# Patient Record
Sex: Female | Born: 1965 | Race: Asian | Hispanic: No | Marital: Married | Smoking: Never smoker
Health system: Southern US, Community
[De-identification: ages and names within clinical notes are randomized; demographics above are authoritative.]

## PROBLEM LIST (undated history)

## (undated) DIAGNOSIS — C801 Malignant (primary) neoplasm, unspecified: Secondary | ICD-10-CM

## (undated) DIAGNOSIS — Z87442 Personal history of urinary calculi: Secondary | ICD-10-CM

---

## 2006-06-10 ENCOUNTER — Emergency Department (HOSPITAL_COMMUNITY): Admission: EM | Admit: 2006-06-10 | Discharge: 2006-06-10 | Payer: Self-pay | Admitting: Family Medicine

## 2006-09-24 ENCOUNTER — Ambulatory Visit (HOSPITAL_COMMUNITY): Admission: RE | Admit: 2006-09-24 | Discharge: 2006-09-24 | Payer: Self-pay | Admitting: Urology

## 2007-07-21 ENCOUNTER — Emergency Department (HOSPITAL_COMMUNITY): Admission: EM | Admit: 2007-07-21 | Discharge: 2007-07-21 | Payer: Self-pay | Admitting: Emergency Medicine

## 2007-08-24 ENCOUNTER — Ambulatory Visit (HOSPITAL_COMMUNITY): Admission: RE | Admit: 2007-08-24 | Discharge: 2007-08-24 | Payer: Self-pay | Admitting: Obstetrics & Gynecology

## 2007-12-24 ENCOUNTER — Inpatient Hospital Stay (HOSPITAL_COMMUNITY): Admission: AD | Admit: 2007-12-24 | Discharge: 2007-12-27 | Payer: Self-pay | Admitting: Obstetrics and Gynecology

## 2007-12-24 ENCOUNTER — Ambulatory Visit: Payer: Self-pay | Admitting: Physician Assistant

## 2011-06-30 LAB — CBC
HCT: 36.5
Hemoglobin: 11.8 — ABNORMAL LOW
Platelets: 171
RDW: 17.1 — ABNORMAL HIGH
WBC: 14.4 — ABNORMAL HIGH

## 2011-06-30 LAB — RPR: RPR Ser Ql: NONREACTIVE

## 2012-01-07 ENCOUNTER — Emergency Department (INDEPENDENT_AMBULATORY_CARE_PROVIDER_SITE_OTHER)
Admission: EM | Admit: 2012-01-07 | Discharge: 2012-01-07 | Disposition: A | Discharge status: Home / Self Care | Payer: Self-pay | Source: Home / Self Care | Attending: Family Medicine | Admitting: Family Medicine

## 2012-01-07 ENCOUNTER — Encounter (HOSPITAL_COMMUNITY): Payer: Self-pay

## 2012-01-07 DIAGNOSIS — E049 Nontoxic goiter, unspecified: Secondary | ICD-10-CM

## 2012-01-07 LAB — TSH: TSH: <0.008 u[IU]/mL — ABNORMAL LOW (ref 0.350–4.500)

## 2012-01-07 LAB — T3, FREE: T3, Free: 5.6 pg/mL — ABNORMAL HIGH (ref 2.3–4.2)

## 2012-01-07 NOTE — ED Notes (Signed)
Here w translator who states she is here for a referral for her thyroid problem; NAD at  present

## 2012-01-07 NOTE — ED Provider Notes (Signed)
History     CSN: [id number]  Arrival date & time 01/07/12  7829   First MD Initiated Contact with Patient 01/07/12 [phone]      Chief Complaint  Patient presents with  . Thyroid Problem    (Consider location/radiation/quality/duration/timing/severity/associated sxs/prior treatment) HPI Comments: Marissa [name] presents today for evaluation of swelling of her anterior neck. She reports a long-standing history of this however she reports increased size over the last 3 weeks. She denies any known thyroid disease. She denies any heart palpitations, cold or heat intolerance, vision disturbance, or dysphagia. She does report a 5 pound weight loss. She does not speak [place]. [place] the [place]. She does not have insurance, Medicaid, or a primary care provider. She is here with a friend translating requesting evaluation for thyroid disease.  Patient is a 46 y.o. female presenting with general illness. The history is provided by the patient.  Illness  The current episode started more than 2 weeks ago. The problem has been gradually worsening. The problem is mild. The symptoms are relieved by nothing. The symptoms are aggravated by nothing. Pertinent negatives include no fever, no decreased vision, no double vision, no photophobia, no abdominal pain, no sore throat, no neck pain and no neck stiffness.    History reviewed. No pertinent past medical history.  History reviewed. No pertinent past surgical history.  History reviewed. No pertinent family history.  History  Substance Use Topics  . Smoking status: Never Smoker   . Smokeless tobacco: Not on file  . Alcohol Use: No    OB History    Grav Para Term Preterm Abortions TAB SAB Ect Mult Living                  Review of Systems  Constitutional: Positive for unexpected weight change. Negative for fever.  HENT: Negative.  Negative for sore throat, trouble swallowing and neck pain.   Eyes: Negative.  Negative for double vision  and photophobia.  Respiratory: Negative.   Cardiovascular: Negative.  Negative for chest pain and palpitations.  Gastrointestinal: Negative.  Negative for abdominal pain.  Genitourinary: Negative.   Musculoskeletal: Negative.   Skin: Negative.   Neurological: Negative.     Allergies  Review of patient's allergies indicates not on file.  Home Medications  No current outpatient prescriptions on file.  BP 112/68  Pulse 72  Temp(Src) 98.5 F (36.9 C) (Oral)  Resp 16  SpO2 98%  Physical Exam  Nursing note and vitals reviewed. Constitutional: She is oriented to person, place, and time. She appears well-developed and well-nourished.  HENT:  Head: Normocephalic and atraumatic.  Eyes: EOM are normal.  Neck: Trachea normal, normal range of motion and full passive range of motion without pain. No tracheal tenderness and no muscular tenderness present. Thyromegaly present.  Cardiovascular: Normal rate, regular rhythm, S1 normal, S2 normal and normal heart sounds.   No murmur heard. Pulmonary/Chest: Effort normal and breath sounds normal. She has no wheezes. She has no rhonchi.  Musculoskeletal: Normal range of motion.  Neurological: She is alert and oriented to person, place, and time.  Skin: Skin is warm, dry and intact. No rash noted.  Psychiatric: Her behavior is normal.    ED Course  Procedures (including critical care time)   Labs Reviewed  TSH  T4, FREE  T3, FREE   No results found.   1. Goiter       MDM  Referred to [facility] and  [facility]; referred to Endocrinology; labs pending        Renaee Munda, MD 01/07/12 1247

## 2012-01-07 NOTE — Discharge Instructions (Signed)
Your examination and history are concerning for thyroid disease. We have drawn blood today to assess thyroid function. We will call you with any abnormal or positive results and therefore further instructions. I provided you with several referral providers. The first 2 are for primary care. The first number is for the [facility]; they accept Medicaid. The second number is for a physician referral service; you may call and inquire about physician offices that accept Medicaid. The last 2 contact numbers are for endocrinologists; these are physicians and evaluate and treat thyroid disorders. Please call them to to make an appointment.

## 2012-01-09 NOTE — ED Notes (Signed)
4/3 Medical follow-up request form received from Dr. Juanetta Gosling for appointment with Endocrinology for a goiter.  4/5  TSH <0.008L Free T 4 1.65, Free T 3 5.6 H. Labs shown to Dr. Juanetta Gosling. He wrote finding suggests Hyperthyroidism. Refer to Endo Chestine Spore or Sharl Ma). I called and Dr. Burna Forts office was closed. I called the [facility]. and left message for the Jeri Modena the new patient coordinator. I faxed the record, labs and referral request to [phone] and confirmation received. Vassie Moselle 01/09/2012

## 2012-01-15 ENCOUNTER — Telehealth (HOSPITAL_COMMUNITY): Payer: Self-pay | Admitting: *Deleted

## 2012-01-22 ENCOUNTER — Telehealth (HOSPITAL_COMMUNITY): Payer: Self-pay | Admitting: *Deleted

## 2012-01-22 NOTE — ED Notes (Signed)
Called pt and spoke to husband and son.  Son states pt has been contacted by [facility] and is waiting for a return call with her appointment time.  Instructed son to call and let us know when the appointment is.

## 2012-05-13 ENCOUNTER — Other Ambulatory Visit: Payer: Self-pay | Admitting: Otolaryngology

## 2012-05-13 DIAGNOSIS — E041 Nontoxic single thyroid nodule: Secondary | ICD-10-CM

## 2012-05-14 ENCOUNTER — Ambulatory Visit
Admission: RE | Admit: 2012-05-14 | Discharge: 2012-05-14 | Disposition: A | Discharge status: Home / Self Care | Payer: BC Managed Care – PPO | Source: Ambulatory Visit | Attending: Otolaryngology | Admitting: Otolaryngology

## 2012-05-14 DIAGNOSIS — E041 Nontoxic single thyroid nodule: Secondary | ICD-10-CM

## 2012-05-19 ENCOUNTER — Other Ambulatory Visit (HOSPITAL_COMMUNITY)
Admission: RE | Admit: 2012-05-19 | Discharge: 2012-05-19 | Disposition: A | Discharge status: Home / Self Care | Payer: BC Managed Care – PPO | Source: Ambulatory Visit | Attending: Otolaryngology | Admitting: Otolaryngology

## 2012-05-19 DIAGNOSIS — E049 Nontoxic goiter, unspecified: Secondary | ICD-10-CM | POA: Insufficient documentation

## 2012-07-14 ENCOUNTER — Encounter (HOSPITAL_COMMUNITY): Payer: Self-pay | Admitting: Pharmacy Technician

## 2012-07-19 ENCOUNTER — Encounter (HOSPITAL_COMMUNITY)
Admission: RE | Admit: 2012-07-19 | Discharge: 2012-07-19 | Disposition: A | Discharge status: Home / Self Care | Payer: BC Managed Care – PPO | Source: Ambulatory Visit | Attending: Otolaryngology | Admitting: Otolaryngology

## 2012-07-19 ENCOUNTER — Encounter (HOSPITAL_COMMUNITY): Payer: Self-pay

## 2012-07-19 LAB — CBC
HCT: 37 % (ref 36.0–46.0)
MCH: 22.8 pg — ABNORMAL LOW (ref 26.0–34.0)
MCV: 72.8 fL — ABNORMAL LOW (ref 78.0–100.0)
Platelets: 183 10*3/uL (ref 150–400)
RDW: 14.6 % (ref 11.5–15.5)
WBC: 6.4 10*3/uL (ref 4.0–10.5)

## 2012-07-19 LAB — BASIC METABOLIC PANEL
Chloride: 105 mEq/L (ref 96–112)
Creatinine, Ser: 0.59 mg/dL (ref 0.50–1.10)
GFR calc Af Amer: >90 mL/min (ref >90)
Sodium: 141 mEq/L (ref 135–145)

## 2012-07-19 NOTE — Progress Notes (Signed)
CXR to anesthesia to review. 

## 2012-07-19 NOTE — Pre-Procedure Instructions (Signed)
20 Cylee Thi  07/19/2012   Your procedure is scheduled on:  07/26/12  Report to [facility] at 530 AM.  Call this number if you have problems the morning of surgery: [phone]   Remember:   Do not eat food:After Midnight.    Take these medicines the morning of surgery with A SIP OF WATER: none   Do not wear jewelry, make-up or nail polish.  Do not wear lotions, powders, or perfumes. You may wear deodorant.  Do not shave 48 hours prior to surgery. Men may shave face and neck.  Do not bring valuables to the hospital.  Contacts, dentures or bridgework may not be worn into surgery.  Leave suitcase in the car. After surgery it may be brought to your room.  For patients admitted to the hospital, checkout time is 11:00 AM the day of discharge.   Patients discharged the day of surgery will not be allowed to drive home.  Name and phone number of your driver: family  Special Instructions: Shower using CHG 2 nights before surgery and the night before surgery.  If you shower the day of surgery use CHG.  Use special wash - you have one bottle of CHG for all showers.  You should use approximately 1/3 of the bottle for each shower.   Please read over the following fact sheets that you were given: Pain Booklet, Coughing and Deep Breathing, MRSA Information and Surgical Site Infection Prevention

## 2012-07-20 NOTE — Consult Note (Signed)
Anesthesia chart review: Patient is a 46 year old [place] female posted for right thyroidectomy, possible total on 07/23/2012 by Dr. Pollyann Kennedy. Non-smoker.   Thyroid ultrasound on 05/14/12 showed: Right thyroid lobe: 6.8 x 2.1 x 2.8 cm.  Left thyroid lobe: 6.5 x 1.6 x 1.8 cm.  Isthmus: 2.5 mm in thickness.  The thyroid gland is slightly well and inhomogeneous with solid nodules bilaterally.  CXR on 07/19/12 showed: There is new mild cardiomegaly. Pulmonary vascularity is normal. Lungs are clear. There is a 3 x 3.2 cm nodule in the right upper lobe medially which is essentially unchanged since a study of 2007 and felt to be a benign mass. Nipple shadows are seen at both lung bases. Slight thoracolumbar scoliosis.   Labs noted.  I discussed new finding of mild cardiomegaly on CXR with Anesthesiologist Dr. Noreene Larsson.  No CV/CHF symptoms were documented at her PAT appointment.  Will not plan to get a  pre-operative EKG.  Patient will be evaluated by her Anesthesiologist on the day of surgery.  Shonna Chock, PA-C

## 2012-07-21 NOTE — H&P (Signed)
Assessment  . 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 Discussed  Right-sided thyroid mass, slowly enlarging over the last 6 years or so. Recommend thyroid ultrasound. We'll discuss possible need for FNA following that. Reason For Visit  Marissa [name] is here today at the kind request of none for consultation and opinion for lump on neck and painful. HPI  Immigrant from [place], has been here for about 6 or 7 years. Over the past 5 or 6 years, she's had a slowly enlarging mass in the right anterior lower neck area. She has no trouble swallowing and no other symptoms or health problems. Allergies  No Known Drug Allergies. Current Meds  No Reported Medications;; RPT. Personal Hx  Never A Smoker Never Drank Alcohol. ROS  12 system ROS was obtained and reviewed on the Health Maintenance form dated today.  Positive responses are shown above.  If the symptom is not checked, the patient has denied it. Vital Signs   Recorded by [facility] on 13 May 2012 01:03 PM BP:102/64,  Height: 61.5 in, Weight: 124 lb, BMI: 23.1 kg/m2,  BSA Calculated: 1.55 ,  BMI Calculated: 23.05. Physical Exam  APPEARANCE: Well developed, well nourished, in no acute distress.  Normal affect, in a pleasant mood.  Oriented to time, place and person. COMMUNICATION: Normal voice   HEAD & FACE:  No scars, lesions or masses of head and face.  Sinuses nontender to palpation.  Salivary glands without mass or tenderness.  Facial strength symmetric.  No facial lesion, scars, or mass. EYES: EOMI with normal primary gaze alignment. Visual acuity grossly intact.  PERRLA EXTERNAL EAR & NOSE: No scars, lesions or masses  EAC & TYMPANIC MEMBRANE:  EAC shows no obstructing lesions or debris and tympanic membranes are normal bilaterally with good movement to insufflation. GROSS HEARING: Normal  TMJ:  Nontender  INTRANASAL EXAM: No polyps or purulence.  NASOPHARYNX:  Normal, without lesions. LIPS, TEETH & GUMS: No lip lesions, normal dentition and normal gums. ORAL CAVITY/OROPHARYNX:  Oral mucosa moist without lesion or asymmetry of the palate, tongue, tonsil or posterior pharynx. LARYNX (mirror exam):  No lesions of the epiglottis, false cord or TVC's and cords move well to phonation. HYPOPHARYNX (mirror exam): No lesions, asymmetry or pooling of secretions. NECK:  Supple without adenopathy or mass. THYROID: 2 cm firm mass in the right upper pole of the thyroid. No other masses palpable.  NEUROLOGIC:  No gross CN deficits. No nystagmus noted.   LYMPHATIC:  No enlarged nodes palpable. Signature  Electronically signed by : Serena Colonel  M.D.; 05/13/2012 1:21 PM EST.

## 2012-07-22 MED ORDER — CEFAZOLIN SODIUM-DEXTROSE 2-3 GM-% IV SOLR
2.0000 g | INTRAVENOUS | Status: AC
  Administered 2012-07-23: 2 g via INTRAVENOUS
  Filled 2012-07-22: qty 50

## 2012-07-23 ENCOUNTER — Encounter (HOSPITAL_COMMUNITY): Payer: Self-pay | Admitting: Vascular Surgery

## 2012-07-23 ENCOUNTER — Ambulatory Visit (HOSPITAL_COMMUNITY): Payer: BC Managed Care – PPO | Admitting: Vascular Surgery

## 2012-07-23 ENCOUNTER — Encounter (HOSPITAL_COMMUNITY): Payer: Self-pay | Admitting: *Deleted

## 2012-07-23 ENCOUNTER — Encounter (HOSPITAL_COMMUNITY)
Admission: RE | Disposition: A | Discharge status: Home / Self Care | Payer: Self-pay | Source: Ambulatory Visit | Attending: Otolaryngology

## 2012-07-23 ENCOUNTER — Observation Stay (HOSPITAL_COMMUNITY)
Admission: RE | Admit: 2012-07-23 | Discharge: 2012-07-24 | Disposition: A | Discharge status: Home / Self Care | Payer: BC Managed Care – PPO | Source: Ambulatory Visit | Attending: Otolaryngology | Admitting: Otolaryngology

## 2012-07-23 DIAGNOSIS — C73 Malignant neoplasm of thyroid gland: Principal | ICD-10-CM | POA: Insufficient documentation

## 2012-07-23 DIAGNOSIS — Z01818 Encounter for other preprocedural examination: Secondary | ICD-10-CM | POA: Insufficient documentation

## 2012-07-23 DIAGNOSIS — Z9889 Other specified postprocedural states: Secondary | ICD-10-CM

## 2012-07-23 DIAGNOSIS — E063 Autoimmune thyroiditis: Secondary | ICD-10-CM | POA: Insufficient documentation

## 2012-07-23 DIAGNOSIS — Z01812 Encounter for preprocedural laboratory examination: Secondary | ICD-10-CM | POA: Insufficient documentation

## 2012-07-23 DIAGNOSIS — Z23 Encounter for immunization: Secondary | ICD-10-CM | POA: Insufficient documentation

## 2012-07-23 HISTORY — PX: THYROIDECTOMY: SHX17

## 2012-07-23 SURGERY — THYROIDECTOMY
Anesthesia: General | Site: Neck | Laterality: Right | Wound class: Clean

## 2012-07-23 MED ORDER — ACETAMINOPHEN 10 MG/ML IV SOLN
1000.0000 mg | Freq: Once | INTRAVENOUS | Status: AC | PRN
  Administered 2012-07-23: 1000 mg via INTRAVENOUS

## 2012-07-23 MED ORDER — MIDAZOLAM HCL 5 MG/5ML IJ SOLN
INTRAMUSCULAR | Status: DC | PRN
  Administered 2012-07-23 (×2): 1 mg via INTRAVENOUS

## 2012-07-23 MED ORDER — FENTANYL CITRATE 0.05 MG/ML IJ SOLN
INTRAMUSCULAR | Status: DC | PRN
  Administered 2012-07-23: 50 ug via INTRAVENOUS
  Administered 2012-07-23: 150 ug via INTRAVENOUS
  Administered 2012-07-23 (×4): 50 ug via INTRAVENOUS

## 2012-07-23 MED ORDER — INFLUENZA VIRUS VACC SPLIT PF IM SUSP
0.5000 mL | INTRAMUSCULAR | Status: AC
  Administered 2012-07-24: 0.5 mL via INTRAMUSCULAR
  Filled 2012-07-23 (×2): qty 0.5

## 2012-07-23 MED ORDER — HYDROMORPHONE HCL PF 1 MG/ML IJ SOLN: 0.2500 mg | INTRAMUSCULAR | Status: DC | PRN

## 2012-07-23 MED ORDER — BACITRACIN ZINC 500 UNIT/GM EX OINT
TOPICAL_OINTMENT | CUTANEOUS | Status: AC
  Filled 2012-07-23: qty 15

## 2012-07-23 MED ORDER — NEOMYCIN-BACITRACIN ZN-POLYMYX 5-400-10000 OP OINT
TOPICAL_OINTMENT | OPHTHALMIC | Status: AC
  Filled 2012-07-23: qty 3.5

## 2012-07-23 MED ORDER — HYDROCODONE-ACETAMINOPHEN 7.5-325 MG PO TABS: 1.0000 | ORAL_TABLET | Freq: Four times a day (QID) | ORAL | Status: DC | PRN

## 2012-07-23 MED ORDER — DEXTROSE-NACL 5-0.9 % IV SOLN
INTRAVENOUS | Status: DC
  Administered 2012-07-23 – 2012-07-24 (×2): 75 mL/h via INTRAVENOUS

## 2012-07-23 MED ORDER — ARTIFICIAL TEARS OP OINT
TOPICAL_OINTMENT | OPHTHALMIC | Status: DC | PRN
  Administered 2012-07-23: 1 via OPHTHALMIC

## 2012-07-23 MED ORDER — LIDOCAINE-EPINEPHRINE 1 %-1:100000 IJ SOLN
INTRAMUSCULAR | Status: AC
  Filled 2012-07-23: qty 1

## 2012-07-23 MED ORDER — LIDOCAINE HCL 4 % MT SOLN
OROMUCOSAL | Status: DC | PRN
  Administered 2012-07-23: 4 mL via TOPICAL

## 2012-07-23 MED ORDER — GLYCOPYRROLATE 0.2 MG/ML IJ SOLN
INTRAMUSCULAR | Status: DC | PRN
  Administered 2012-07-23: 0.4 mg via INTRAVENOUS
  Administered 2012-07-23: 0.2 mg via INTRAVENOUS

## 2012-07-23 MED ORDER — PROMETHAZINE HCL 25 MG RE SUPP: 25.0000 mg | Freq: Four times a day (QID) | RECTAL | Status: DC | PRN

## 2012-07-23 MED ORDER — PROPOFOL 10 MG/ML IV BOLUS
INTRAVENOUS | Status: DC | PRN
  Administered 2012-07-23: 110 mg via INTRAVENOUS

## 2012-07-23 MED ORDER — ONDANSETRON HCL 4 MG/2ML IJ SOLN: 4.0000 mg | Freq: Once | INTRAMUSCULAR | Status: DC | PRN

## 2012-07-23 MED ORDER — LIDOCAINE-EPINEPHRINE 1 %-1:100000 IJ SOLN
INTRAMUSCULAR | Status: DC | PRN
  Administered 2012-07-23: 2 mL

## 2012-07-23 MED ORDER — HYDROCODONE-ACETAMINOPHEN 5-325 MG PO TABS
1.0000 | ORAL_TABLET | ORAL | Status: DC | PRN
  Administered 2012-07-23: 2 via ORAL
  Administered 2012-07-23: 1 via ORAL
  Administered 2012-07-23 – 2012-07-24 (×2): 2 via ORAL
  Filled 2012-07-23 (×3): qty 2

## 2012-07-23 MED ORDER — NEOSTIGMINE METHYLSULFATE 1 MG/ML IJ SOLN
INTRAMUSCULAR | Status: DC | PRN
  Administered 2012-07-23: 1 mg via INTRAVENOUS
  Administered 2012-07-23: 3 mg via INTRAVENOUS

## 2012-07-23 MED ORDER — LIDOCAINE HCL (CARDIAC) 20 MG/ML IV SOLN
INTRAVENOUS | Status: DC | PRN
  Administered 2012-07-23: 30 mg via INTRAVENOUS

## 2012-07-23 MED ORDER — PROMETHAZINE HCL 25 MG PO TABS: 25.0000 mg | ORAL_TABLET | Freq: Four times a day (QID) | ORAL | Status: DC | PRN

## 2012-07-23 MED ORDER — ONDANSETRON HCL 4 MG/2ML IJ SOLN
INTRAMUSCULAR | Status: DC | PRN
  Administered 2012-07-23: 4 mg via INTRAVENOUS

## 2012-07-23 MED ORDER — IBUPROFEN 100 MG/5ML PO SUSP
400.0000 mg | Freq: Four times a day (QID) | ORAL | Status: DC | PRN
  Administered 2012-07-23: 400 mg via ORAL
  Filled 2012-07-23: qty 20

## 2012-07-23 MED ORDER — LACTATED RINGERS IV SOLN
INTRAVENOUS | Status: DC | PRN
  Administered 2012-07-23: 07:00:00 via INTRAVENOUS

## 2012-07-23 MED ORDER — HYDROCODONE-ACETAMINOPHEN 7.5-500 MG/15ML PO SOLN
ORAL | Status: AC
  Filled 2012-07-23: qty 15

## 2012-07-23 MED ORDER — PHENYLEPHRINE HCL 10 MG/ML IJ SOLN
INTRAMUSCULAR | Status: DC | PRN
  Administered 2012-07-23 (×2): 40 ug via INTRAVENOUS

## 2012-07-23 MED ORDER — ACETAMINOPHEN 10 MG/ML IV SOLN
INTRAVENOUS | Status: AC
  Filled 2012-07-23: qty 100

## 2012-07-23 MED ORDER — ROCURONIUM BROMIDE 100 MG/10ML IV SOLN
INTRAVENOUS | Status: DC | PRN
  Administered 2012-07-23: 10 mg via INTRAVENOUS
  Administered 2012-07-23: 40 mg via INTRAVENOUS

## 2012-07-23 SURGICAL SUPPLY — 43 items
ADH SKN CLS APL DERMABOND .7 (GAUZE/BANDAGES/DRESSINGS) ×1
APPLIER CLIP 9.375 SM OPEN (CLIP)
APR CLP SM 9.3 20 MLT OPN (CLIP)
ATTRACTOMAT 16X20 MAGNETIC DRP (DRAPES) IMPLANT
CANISTER SUCTION 2500CC (MISCELLANEOUS) ×2 IMPLANT
CLEANER TIP ELECTROSURG 2X2 (MISCELLANEOUS) ×2 IMPLANT
CLIP APPLIE 9.375 SM OPEN (CLIP) IMPLANT
CLOTH BEACON ORANGE TIMEOUT ST (SAFETY) ×2 IMPLANT
CONT SPEC 4OZ CLIKSEAL STRL BL (MISCELLANEOUS) ×2 IMPLANT
CORDS BIPOLAR (ELECTRODE) ×2 IMPLANT
COVER SURGICAL LIGHT HANDLE (MISCELLANEOUS) ×2 IMPLANT
DECANTER SPIKE VIAL GLASS SM (MISCELLANEOUS) ×2 IMPLANT
DERMABOND ADVANCED (GAUZE/BANDAGES/DRESSINGS) ×1
DERMABOND ADVANCED .7 DNX12 (GAUZE/BANDAGES/DRESSINGS) IMPLANT
DRAIN SNY 10 ROU (WOUND CARE) ×1 IMPLANT
ELECT COATED BLADE 2.86 ST (ELECTRODE) ×2 IMPLANT
ELECT REM PT RETURN 9FT ADLT (ELECTROSURGICAL) ×2
ELECTRODE REM PT RTRN 9FT ADLT (ELECTROSURGICAL) ×1 IMPLANT
EVACUATOR SILICONE 100CC (DRAIN) ×2 IMPLANT
GAUZE SPONGE 4X4 16PLY XRAY LF (GAUZE/BANDAGES/DRESSINGS) ×1 IMPLANT
GLOVE BIO SURGEON STRL SZ 6.5 (GLOVE) ×1 IMPLANT
GLOVE ECLIPSE 7.5 STRL STRAW (GLOVE) ×2 IMPLANT
GOWN PREVENTION PLUS LG XLONG (DISPOSABLE) ×5 IMPLANT
KIT BASIN OR (CUSTOM PROCEDURE TRAY) ×2 IMPLANT
KIT ROOM TURNOVER OR (KITS) ×2 IMPLANT
NEEDLE 27GAX1X1/2 (NEEDLE) ×2 IMPLANT
NS IRRIG 1000ML POUR BTL (IV SOLUTION) ×2 IMPLANT
PAD ARMBOARD 7.5X6 YLW CONV (MISCELLANEOUS) ×4 IMPLANT
PENCIL FOOT CONTROL (ELECTRODE) ×2 IMPLANT
SPECIMEN JAR MEDIUM (MISCELLANEOUS) ×1 IMPLANT
SPONGE INTESTINAL PEANUT (DISPOSABLE) IMPLANT
STAPLER VISISTAT 35W (STAPLE) ×2 IMPLANT
SUT CHROMIC 3 0 SH 27 (SUTURE) IMPLANT
SUT CHROMIC 4 0 PS 2 18 (SUTURE) ×4 IMPLANT
SUT ETHILON 3 0 PS 1 (SUTURE) IMPLANT
SUT ETHILON 5 0 P 3 18 (SUTURE) ×1
SUT NYLON ETHILON 5-0 P-3 1X18 (SUTURE) ×1 IMPLANT
SUT SILK 3 0 REEL (SUTURE) IMPLANT
SUT SILK 4 0 REEL (SUTURE) ×3 IMPLANT
TOWEL OR 17X24 6PK STRL BLUE (TOWEL DISPOSABLE) ×2 IMPLANT
TOWEL OR 17X26 10 PK STRL BLUE (TOWEL DISPOSABLE) ×2 IMPLANT
TRAY ENT MC OR (CUSTOM PROCEDURE TRAY) ×2 IMPLANT
WATER STERILE IRR 1000ML POUR (IV SOLUTION) IMPLANT

## 2012-07-23 NOTE — OR Nursing (Signed)
Vocalizes with slight hoarseness/ no difficulty swallowing / no hematoma or swelling noted

## 2012-07-23 NOTE — Anesthesia Postprocedure Evaluation (Signed)
  Anesthesia Post-op Note  Patient: Marissa [name]  Procedure(s) Performed: Procedure(s) (LRB) with comments: THYROIDECTOMY (Right) - Right thyroid lobectomy possible total   Patient Location: PACU  Anesthesia Type: General  Level of Consciousness: awake, alert  and oriented  Airway and Oxygen Therapy: Patient Spontanous Breathing  Post-op Pain: mild  Post-op Assessment: Post-op Vital signs reviewed and Patient's Cardiovascular Status Stable  Post-op Vital Signs: stable  Complications: No apparent anesthesia complications

## 2012-07-23 NOTE — Anesthesia Postprocedure Evaluation (Signed)
  Anesthesia Post-op Note  Patient: Marissa [name]  Procedure(s) Performed: Procedure(s) (LRB) with comments: THYROIDECTOMY (Right) - Right thyroid lobectomy possible total   Patient Location: PACU  Anesthesia Type: General  Level of Consciousness: awake, alert  and oriented  Airway and Oxygen Therapy: Patient Spontanous Breathing  Post-op Pain: moderate  Post-op Assessment: Post-op Vital signs reviewed  Post-op Vital Signs: Reviewed and stable  Complications: No apparent anesthesia complications

## 2012-07-23 NOTE — Anesthesia Preprocedure Evaluation (Addendum)
Anesthesia Evaluation  Patient identified by MRN, date of birth, ID band Patient awake    Reviewed: Allergy & Precautions, H&P , NPO status , Patient's Chart, lab work & pertinent test results  History of Anesthesia Complications Negative for: history of anesthetic complications  Airway Mallampati: II      Dental  (+) Teeth Intact and Dental Advisory Given   Pulmonary neg pulmonary ROS,  breath sounds clear to auscultation        Cardiovascular negative cardio ROS  Rhythm:Regular Rate:Normal     Neuro/Psych negative neurological ROS     GI/Hepatic negative GI ROS, Neg liver ROS,   Endo/Other    Renal/GU negative Renal ROS  negative genitourinary   Musculoskeletal negative musculoskeletal ROS (+)   Abdominal   Peds  Hematology negative hematology ROS (+)   Anesthesia Other Findings   Reproductive/Obstetrics negative OB ROS                          Anesthesia Physical Anesthesia Plan  ASA: III  Anesthesia Plan: General   Post-op Pain Management:    Induction: Intravenous  Airway Management Planned: Oral ETT  Additional Equipment:   Intra-op Plan:   Post-operative Plan: Extubation in OR  Informed Consent: I have reviewed the patients History and Physical, chart, labs and discussed the procedure including the risks, benefits and alternatives for the proposed anesthesia with the patient or authorized representative who has indicated his/her understanding and acceptance.   Dental advisory given  Plan Discussed with:   Anesthesia Plan Comments: (Thyroid mass  Speaks little English  Kipp Brood, MD)        Anesthesia Quick Evaluation

## 2012-07-23 NOTE — Op Note (Signed)
OPERATIVE REPORT  DATE OF SURGERY: 07/23/2012  PATIENT:  Marissa [name],  46 y.o. female  PRE-OPERATIVE DIAGNOSIS:  thyroid nodule  POST-OPERATIVE DIAGNOSIS:  thyroid nodule  PROCEDURE:  Procedure(s): THYROIDECTOMY  SURGEON:  Susy Frizzle, MD  ASSISTANTS: Aquilla Hacker, PA    ANESTHESIA:   general  EBL:  20 ml  DRAINS: (1/8') Jackson-Pratt drain(s) with closed bulb suction in the neck   LOCAL MEDICATIONS USED:  LIDOCAINE   SPECIMEN:  Source of Specimen:  Right thyroid lobectomy with isthmus  COUNTS:  YES  PROCEDURE DETAILS: Patient was taken to the operating room and placed on the operating table in the supine position. Following induction of general endotracheal anesthesia, the neck was prepped and draped in a standard fashion. A shoulder roll was placed beneath the shoulder blades to extend the neck. A transverse incision was outlined with a marking pen along a lower cervical skin crease. This was then injected with Xylocaine/epinephrine solution. A 15 scalpel was used to incise the skin and subcutaneous tissue. Electrocautery was used to continue the dissection down to the subplatysmal layer. Flaps rotated up to the thyroid notch and down to the clavicle. A thyroid retractor was used for the remainder of the case. The midline fascia was divided and the strap muscles were reflected laterally off the right thyroid lobe. The thyroid gland had multiple hard nodules. There was one dominant 1 which was the one that was biopsied. It was reflected towards the left side while the dissection was carried out right along the capsule. The superior vasculature was separately identified, ligated between clamps and divided. The middle thyroid vein was treated in a similar fashion. As the gland was brought forward, the recurrent nerve was identified as well as a parathyroid gland. These were preserved. The gland was brought off the trachea. The ligament of Allyson Sabal was divided as the gland was brought off  of the trachea isthmus was divided on the left side to accomplish a small nodule that is present in the left inferior isthmus. 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 4-0 silk ties were used throughout the case for ligatures. The wound was irrigated with saline and hemostasis was completed. The drain was left in the wound and exited through the left-sided incision, secured in place with nylon suture. The midline fascia and subcutaneous layer were reapproximated as well as the platysma layer, using 4-0 chromic suture. Dermabond was used on the skin.   PATIENT DISPOSITION:  PACU - hemodynamically stable.

## 2012-07-23 NOTE — Interval H&P Note (Signed)
History and Physical Interval Note:  07/23/2012 7:17 AM  Marissa [name]  has presented today for surgery, with the diagnosis of thyroid nodule  The various methods of treatment have been discussed with the patient and family. After consideration of risks, benefits and other options for treatment, the patient has consented to  Procedure(s) (LRB) with comments: THYROIDECTOMY (Right) - Right thyroid lobectomy possible total  as a surgical intervention .  The patient's history has been reviewed, patient examined, no change in status, stable for surgery.  I have reviewed the patient's chart and labs.  Questions were answered to the patient's satisfaction.     Sendy Pluta

## 2012-07-23 NOTE — Preoperative (Signed)
Beta Blockers   Reason not to administer Beta Blockers: not prescribed 

## 2012-07-23 NOTE — Anesthesia Procedure Notes (Signed)
Procedure Name: Intubation Date/Time: 07/23/2012 7:57 AM Performed by: Gayla Medicus Pre-anesthesia Checklist: Patient identified, Timeout performed, Emergency Drugs available, Suction available and Patient being monitored Patient Marissa [name]-evaluated:Patient Marissa [name]-evaluated prior to inductionOxygen Delivery Method: Circle system utilized Preoxygenation: Pre-oxygenation with 100% oxygen Intubation Type: IV induction Ventilation: Mask ventilation without difficulty Laryngoscope Size: Mac and 3 Grade View: Grade I Tube type: Oral Tube size: 7.0 mm Number of attempts: 1 Airway Equipment and Method: Stylet and LTA kit utilized Placement Confirmation: ETT inserted through vocal cords under direct vision,  positive ETCO2 and breath sounds checked- equal and bilateral Secured at: 22 cm Tube secured with: Tape Dental Injury: Teeth and Oropharynx as per pre-operative assessment

## 2012-07-23 NOTE — Progress Notes (Signed)
Day of Surgery  Subjective: Patient does not speak english.  Her husband speaks some english and says that she is having pain with swallowing.  No other complaints.  Objective: Vital signs in last 24 hours: Temp:  [97.4 F (36.3 C)-98.4 F (36.9 C)] 98.2 F (36.8 C) (10/18 1654) Pulse Rate:  [67-93] 86  (10/18 1654) Resp:  [12-20] 18  (10/18 1654) BP: (102-124)/(51-79) 116/62 mmHg (10/18 1654) SpO2:  [99 %-100 %] 100 % (10/18 1654)    Intake/Output from previous day:   Intake/Output this shift: Total I/O In: 1845 [P.O.:240; I.V.:1600; Other:5] Out: -   General appearance: alert, cooperative and no distress Neck: thryoidectomy incision clean and intact.  No fluid collection.  Drain functioning.  Voice sounds good.  Lab Results:  No results found for this basename: WBC:2,HGB:2,HCT:2,PLT:2 in the last 72 hours BMET No results found for this basename: NA:2,K:2,CL:2,CO2:2,GLUCOSE:2,BUN:2,CREATININE:2,CALCIUM:2 in the last 72 hours PT/INR No results found for this basename: LABPROT:2,INR:2 in the last 72 hours ABG No results found for this basename: PHART:2,PCO2:2,PO2:2,HCO3:2 in the last 72 hours  Studies/Results: No results found.  Anti-infectives: Anti-infectives     Start     Dose/Rate Route Frequency Ordered Stop   07/23/12 0600   ceFAZolin (ANCEF) IVPB 2 g/50 mL premix        2 g 100 mL/hr over 30 Minutes Intravenous On call to O.R. 07/22/12 1444 07/23/12 0808          Assessment/Plan: s/p Procedure(s) (LRB) with comments: THYROIDECTOMY (Right) - Right thyroid lobectomy possible total  Doing well.  Pain medication.  Drain overnight.  Likely discharge tomorrow.  LOS: 0 days    Marissa [name] 07/23/2012

## 2012-07-23 NOTE — Transfer of Care (Signed)
Immediate Anesthesia Transfer of Care Note  Patient: Marissa [name]  Procedure(s) Performed: Procedure(s) (LRB) with comments: THYROIDECTOMY (Right) - Right thyroid lobectomy possible total   Patient Location: PACU  Anesthesia Type: General  Level of Consciousness: awake and alert   Airway & Oxygen Therapy: Patient Spontanous Breathing and Patient connected to nasal cannula oxygen  Post-op Assessment: Report given to PACU RN, Post -op Vital signs reviewed and stable and Patient moving all extremities X 4  Post vital signs: Reviewed and stable  Complications: No apparent anesthesia complications

## 2012-07-24 NOTE — Discharge Summary (Signed)
Physician Discharge Summary  Patient ID: Marissa [name] MRN: [id number] DOB/AGE: [birth year] 46 y.o.  Admit date: 07/23/2012 Discharge date: 07/24/2012  Admission Diagnoses: Right thyroid nodule  Discharge Diagnoses: Same.   Discharged Condition: good  Hospital Course: 46 year old female underwent right thyroid lobectomy for thyroid nodule.  For details, see the operative note.  After surgery, she was admitted for observation with a drain in place.  She did quite well overnight with limited pain.  Eating and drinking well and ambulating.  Good voice.  Consults: None  Significant Diagnostic Studies: None  Treatments: surgery: right thyroid lobectomy  Discharge Exam: Blood pressure 111/61, pulse 87, temperature 97.9 F (36.6 C), temperature source Oral, resp. rate 16, height 5\' 1"  (1.549 m), weight 58.786 kg (129 lb 9.6 oz), SpO2 100.00%. General appearance: alert, cooperative and no distress Neck: thyroidectomy incision clean and intact. No fluid collection.  Drain removed.  Normal voice.  Disposition: 01-Home or Self Care  Discharge Orders    Future Orders Please Complete By Expires   Diet - low sodium heart healthy      Diet - low sodium heart healthy      Increase activity slowly      Increase activity slowly      Discharge instructions      Comments:   Do not apply any ointment to the incision.  OK to allow it to get wet.  Gently pat dry.  Call with fever, pus drainage, or increasing pain or redness.       Medication List     As of 07/24/2012 11:18 AM    TAKE these medications         HYDROcodone-acetaminophen 7.5-325 MG per tablet   Commonly known as: NORCO   Take 1 tablet by mouth every 6 (six) hours as needed for pain.      promethazine 25 MG suppository   Commonly known as: PHENERGAN   Place 1 suppository (25 mg total) rectally every 6 (six) hours as needed for nausea.           Follow-up Information    Follow up with Serena Colonel, MD. Schedule an  appointment as soon as possible for a visit in 1 week.   Contact information:   [address], [address] [place], [place] [address] [place] [address] [phone]          Signed: Christia Reading 07/24/2012, 11:18 AM

## 2012-07-27 ENCOUNTER — Encounter (HOSPITAL_COMMUNITY): Payer: Self-pay | Admitting: Otolaryngology

## 2012-09-14 ENCOUNTER — Encounter (HOSPITAL_COMMUNITY): Payer: Self-pay

## 2012-09-14 ENCOUNTER — Encounter (HOSPITAL_COMMUNITY)
Admission: RE | Admit: 2012-09-14 | Discharge: 2012-09-14 | Disposition: A | Discharge status: Home / Self Care | Payer: BC Managed Care – PPO | Source: Ambulatory Visit | Attending: Otolaryngology | Admitting: Otolaryngology

## 2012-09-14 LAB — CBC
HCT: 37.8 % (ref 36.0–46.0)
Hemoglobin: 12.1 g/dL (ref 12.0–15.0)
MCH: 24 pg — ABNORMAL LOW (ref 26.0–34.0)
MCV: 74.9 fL — ABNORMAL LOW (ref 78.0–100.0)
RBC: 5.05 MIL/uL (ref 3.87–5.11)

## 2012-09-14 NOTE — Pre-Procedure Instructions (Signed)
20 Marissa [name]  09/14/2012   Your procedure is scheduled on:  Decmber 11, 2013  Wednesday  Report to [facility] at 0930 AM.  Call this number if you have problems the morning of surgery: [phone]   Remember:   Do not eat food do not drink liquids :After Midnight.   Clear liquids include soda, tea, black coffee, apple or grape juice, broth.  Take these medicines the morning of surgery with A SIP OF WATER: none   Do not wear jewelry, make-up or nail polish.  Do not wear lotions, powders, or perfumes. You may wear deodorant.  Do not shave 48 hours prior to surgery. Men may shave face and neck.  Do not bring valuables to the hospital.  Contacts, dentures or bridgework may not be worn into surgery.  Leave suitcase in the car. After surgery it may be brought to your room.  For patients admitted to the hospital, checkout time is 11:00 AM the day of discharge.   Patients discharged the day of surgery will not be allowed to drive home.  Name and phone number of your driver: Sharee Pimple- husband  Special Instructions: N/A   Please read over the following fact sheets that you were given: Pain Booklet, Coughing and Deep Breathing, Lab Information, MRSA Information and Surgical Site Infection Prevention

## 2012-09-15 ENCOUNTER — Encounter (HOSPITAL_COMMUNITY): Payer: Self-pay | Admitting: Anesthesiology

## 2012-09-15 ENCOUNTER — Observation Stay (HOSPITAL_COMMUNITY)
Admission: RE | Admit: 2012-09-15 | Discharge: 2012-09-16 | Disposition: A | Discharge status: Home / Self Care | Payer: BC Managed Care – PPO | Source: Ambulatory Visit | Attending: Otolaryngology | Admitting: Otolaryngology

## 2012-09-15 ENCOUNTER — Ambulatory Visit (HOSPITAL_COMMUNITY): Payer: BC Managed Care – PPO | Admitting: Anesthesiology

## 2012-09-15 ENCOUNTER — Encounter (HOSPITAL_COMMUNITY)
Admission: RE | Disposition: A | Discharge status: Home / Self Care | Payer: Self-pay | Source: Ambulatory Visit | Attending: Otolaryngology

## 2012-09-15 DIAGNOSIS — Z9889 Other specified postprocedural states: Secondary | ICD-10-CM

## 2012-09-15 DIAGNOSIS — Z01812 Encounter for preprocedural laboratory examination: Secondary | ICD-10-CM | POA: Insufficient documentation

## 2012-09-15 DIAGNOSIS — C73 Malignant neoplasm of thyroid gland: Principal | ICD-10-CM | POA: Insufficient documentation

## 2012-09-15 HISTORY — PX: THYROIDECTOMY: SHX17

## 2012-09-15 HISTORY — DX: Malignant (primary) neoplasm, unspecified: C80.1

## 2012-09-15 LAB — CALCIUM
Calcium: 8.7 mg/dL (ref 8.4–10.5)
Calcium: 8.3 mg/dL — ABNORMAL LOW (ref 8.4–10.5)

## 2012-09-15 SURGERY — THYROIDECTOMY
Anesthesia: General | Site: Neck | Laterality: Left | Wound class: Clean

## 2012-09-15 MED ORDER — LIDOCAINE-EPINEPHRINE 1 %-1:100000 IJ SOLN: INTRAMUSCULAR | Status: DC | PRN

## 2012-09-15 MED ORDER — GLYCOPYRROLATE 0.2 MG/ML IJ SOLN
INTRAMUSCULAR | Status: DC | PRN
  Administered 2012-09-15: 0.3 mg via INTRAVENOUS

## 2012-09-15 MED ORDER — ONDANSETRON HCL 4 MG/2ML IJ SOLN
INTRAMUSCULAR | Status: DC | PRN
  Administered 2012-09-15: 4 mg via INTRAVENOUS

## 2012-09-15 MED ORDER — PROMETHAZINE HCL 25 MG RE SUPP: 25.0000 mg | Freq: Four times a day (QID) | RECTAL | Status: DC | PRN

## 2012-09-15 MED ORDER — DEXTROSE-NACL 5-0.9 % IV SOLN
INTRAVENOUS | Status: DC
  Administered 2012-09-15: 15:00:00 via INTRAVENOUS
  Administered 2012-09-16: 75 mL/h via INTRAVENOUS

## 2012-09-15 MED ORDER — FENTANYL CITRATE 0.05 MG/ML IJ SOLN
INTRAMUSCULAR | Status: DC | PRN
  Administered 2012-09-15 (×2): 50 ug via INTRAVENOUS
  Administered 2012-09-15: 100 ug via INTRAVENOUS
  Administered 2012-09-15: 50 ug via INTRAVENOUS

## 2012-09-15 MED ORDER — ROCURONIUM BROMIDE 100 MG/10ML IV SOLN
INTRAVENOUS | Status: DC | PRN
  Administered 2012-09-15: 30 mg via INTRAVENOUS

## 2012-09-15 MED ORDER — LIDOCAINE-EPINEPHRINE 1 %-1:100000 IJ SOLN
INTRAMUSCULAR | Status: AC
  Filled 2012-09-15: qty 1

## 2012-09-15 MED ORDER — LIDOCAINE HCL (CARDIAC) 20 MG/ML IV SOLN
INTRAVENOUS | Status: DC | PRN
  Administered 2012-09-15: 90 mg via INTRAVENOUS

## 2012-09-15 MED ORDER — BACITRACIN ZINC 500 UNIT/GM EX OINT
TOPICAL_OINTMENT | CUTANEOUS | Status: AC
  Filled 2012-09-15: qty 15

## 2012-09-15 MED ORDER — FENTANYL CITRATE 0.05 MG/ML IJ SOLN
INTRAMUSCULAR | Status: AC
  Filled 2012-09-15: qty 2

## 2012-09-15 MED ORDER — SUCCINYLCHOLINE CHLORIDE 20 MG/ML IJ SOLN
INTRAMUSCULAR | Status: DC | PRN
  Administered 2012-09-15: 100 mg via INTRAVENOUS

## 2012-09-15 MED ORDER — PHENOL 1.4 % MT LIQD
1.0000 | OROMUCOSAL | Status: DC | PRN
Start: 2012-09-15 — End: 2012-09-16
  Administered 2012-09-15 – 2012-09-16 (×3): 1 via OROMUCOSAL
  Filled 2012-09-15: qty 177

## 2012-09-15 MED ORDER — PHENYLEPHRINE HCL 10 MG/ML IJ SOLN
INTRAMUSCULAR | Status: DC | PRN
  Administered 2012-09-15 (×2): 80 ug via INTRAVENOUS

## 2012-09-15 MED ORDER — OXYCODONE HCL 5 MG PO TABS: 5.0000 mg | ORAL_TABLET | Freq: Once | ORAL | Status: DC | PRN

## 2012-09-15 MED ORDER — 0.9 % SODIUM CHLORIDE (POUR BTL) OPTIME
TOPICAL | Status: DC | PRN
  Administered 2012-09-15: 1000 mL

## 2012-09-15 MED ORDER — PROPOFOL 10 MG/ML IV BOLUS
INTRAVENOUS | Status: DC | PRN
  Administered 2012-09-15: 150 mg via INTRAVENOUS

## 2012-09-15 MED ORDER — HYDROMORPHONE HCL PF 1 MG/ML IJ SOLN
INTRAMUSCULAR | Status: DC | PRN
  Administered 2012-09-15 (×4): .25 mg via INTRAVENOUS

## 2012-09-15 MED ORDER — FENTANYL CITRATE 0.05 MG/ML IJ SOLN
25.0000 ug | INTRAMUSCULAR | Status: DC | PRN
  Administered 2012-09-15: 50 ug via INTRAVENOUS

## 2012-09-15 MED ORDER — HYDROCODONE-ACETAMINOPHEN 5-325 MG PO TABS
1.0000 | ORAL_TABLET | ORAL | Status: DC | PRN
  Administered 2012-09-15: 2 via ORAL
  Filled 2012-09-15: qty 2

## 2012-09-15 MED ORDER — PROMETHAZINE HCL 25 MG PO TABS: 25.0000 mg | ORAL_TABLET | Freq: Four times a day (QID) | ORAL | Status: DC | PRN

## 2012-09-15 MED ORDER — IBUPROFEN 100 MG/5ML PO SUSP
400.0000 mg | Freq: Four times a day (QID) | ORAL | Status: DC | PRN
  Administered 2012-09-15 – 2012-09-16 (×2): 400 mg via ORAL
  Filled 2012-09-15 (×2): qty 20

## 2012-09-15 MED ORDER — CEFAZOLIN SODIUM-DEXTROSE 2-3 GM-% IV SOLR
2.0000 g | INTRAVENOUS | Status: AC
  Administered 2012-09-15: 2 g via INTRAVENOUS
  Filled 2012-09-15: qty 50

## 2012-09-15 MED ORDER — ONDANSETRON HCL 4 MG/2ML IJ SOLN: 4.0000 mg | Freq: Four times a day (QID) | INTRAMUSCULAR | Status: DC | PRN

## 2012-09-15 MED ORDER — OXYCODONE HCL 5 MG/5ML PO SOLN: 5.0000 mg | Freq: Once | ORAL | Status: DC | PRN

## 2012-09-15 MED ORDER — LEVOTHYROXINE SODIUM 100 MCG PO TABS: 100.0000 ug | ORAL_TABLET | Freq: Every day | ORAL | Status: DC

## 2012-09-15 MED ORDER — MORPHINE SULFATE 2 MG/ML IJ SOLN
2.0000 mg | INTRAMUSCULAR | Status: DC | PRN
  Administered 2012-09-15 – 2012-09-16 (×3): 2 mg via INTRAVENOUS
  Filled 2012-09-15 (×3): qty 1

## 2012-09-15 MED ORDER — OXYCODONE-ACETAMINOPHEN 5-325 MG PO TABS
1.0000 | ORAL_TABLET | ORAL | Status: DC | PRN
  Administered 2012-09-16: 2 via ORAL
  Filled 2012-09-15: qty 2

## 2012-09-15 MED ORDER — MIDAZOLAM HCL 5 MG/5ML IJ SOLN
INTRAMUSCULAR | Status: DC | PRN
  Administered 2012-09-15: 2 mg via INTRAVENOUS

## 2012-09-15 MED ORDER — HYDROCODONE-ACETAMINOPHEN 7.5-325 MG PO TABS: 1.0000 | ORAL_TABLET | Freq: Four times a day (QID) | ORAL | Status: DC | PRN

## 2012-09-15 MED ORDER — ARTIFICIAL TEARS OP OINT
TOPICAL_OINTMENT | OPHTHALMIC | Status: DC | PRN
  Administered 2012-09-15: 1 via OPHTHALMIC

## 2012-09-15 MED ORDER — LEVOTHYROXINE SODIUM 100 MCG PO TABS
100.0000 ug | ORAL_TABLET | Freq: Every day | ORAL | Status: DC
  Administered 2012-09-16: 100 ug via ORAL
  Filled 2012-09-15 (×2): qty 1

## 2012-09-15 MED ORDER — NEOSTIGMINE METHYLSULFATE 1 MG/ML IJ SOLN
INTRAMUSCULAR | Status: DC | PRN
  Administered 2012-09-15: 2 mg via INTRAVENOUS

## 2012-09-15 MED ORDER — LACTATED RINGERS IV SOLN
INTRAVENOUS | Status: DC
  Administered 2012-09-15: 10:00:00 via INTRAVENOUS

## 2012-09-15 SURGICAL SUPPLY — 50 items
ADH SKN CLS APL DERMABOND .7 (GAUZE/BANDAGES/DRESSINGS) ×1
APPLIER CLIP 9.375 SM OPEN (CLIP) ×2
APR CLP SM 9.3 20 MLT OPN (CLIP) ×1
ATTRACTOMAT 16X20 MAGNETIC DRP (DRAPES) IMPLANT
CANISTER SUCTION 2500CC (MISCELLANEOUS) ×2 IMPLANT
CLEANER TIP ELECTROSURG 2X2 (MISCELLANEOUS) ×2 IMPLANT
CLIP APPLIE 9.375 SM OPEN (CLIP) IMPLANT
CLOTH BEACON ORANGE TIMEOUT ST (SAFETY) ×2 IMPLANT
CONT SPEC 4OZ CLIKSEAL STRL BL (MISCELLANEOUS) ×2 IMPLANT
CORDS BIPOLAR (ELECTRODE) ×2 IMPLANT
COVER SURGICAL LIGHT HANDLE (MISCELLANEOUS) ×2 IMPLANT
DECANTER SPIKE VIAL GLASS SM (MISCELLANEOUS) ×2 IMPLANT
DERMABOND ADVANCED (GAUZE/BANDAGES/DRESSINGS) ×1
DERMABOND ADVANCED .7 DNX12 (GAUZE/BANDAGES/DRESSINGS) IMPLANT
DRAIN SNY 10 ROU (WOUND CARE) ×1 IMPLANT
ELECT COATED BLADE 2.86 ST (ELECTRODE) ×2 IMPLANT
ELECT REM PT RETURN 9FT ADLT (ELECTROSURGICAL) ×2
ELECTRODE REM PT RTRN 9FT ADLT (ELECTROSURGICAL) ×1 IMPLANT
EVACUATOR SILICONE 100CC (DRAIN) ×2 IMPLANT
GAUZE SPONGE 4X4 16PLY XRAY LF (GAUZE/BANDAGES/DRESSINGS) ×2 IMPLANT
GLOVE BIO SURGEON STRL SZ 6.5 (GLOVE) IMPLANT
GLOVE BIOGEL PI IND STRL 6 (GLOVE) IMPLANT
GLOVE BIOGEL PI INDICATOR 6 (GLOVE) ×2
GLOVE ECLIPSE 7.5 STRL STRAW (GLOVE) ×2 IMPLANT
GLOVE ECLIPSE 8.0 STRL XLNG CF (GLOVE) ×1 IMPLANT
GLOVE SURG SS PI 6.5 STRL IVOR (GLOVE) ×1 IMPLANT
GLOVE SURG SS PI 8.5 STRL IVOR (GLOVE) ×1
GLOVE SURG SS PI 8.5 STRL STRW (GLOVE) IMPLANT
GOWN PREVENTION PLUS LG XLONG (DISPOSABLE) ×6 IMPLANT
GOWN STRL NON-REIN LRG LVL3 (GOWN DISPOSABLE) ×1 IMPLANT
KIT BASIN OR (CUSTOM PROCEDURE TRAY) ×2 IMPLANT
KIT ROOM TURNOVER OR (KITS) ×2 IMPLANT
NEEDLE 27GAX1X1/2 (NEEDLE) ×2 IMPLANT
NS IRRIG 1000ML POUR BTL (IV SOLUTION) ×2 IMPLANT
PAD ARMBOARD 7.5X6 YLW CONV (MISCELLANEOUS) ×4 IMPLANT
PENCIL FOOT CONTROL (ELECTRODE) ×2 IMPLANT
SPECIMEN JAR MEDIUM (MISCELLANEOUS) ×1 IMPLANT
SPONGE INTESTINAL PEANUT (DISPOSABLE) IMPLANT
STAPLER VISISTAT 35W (STAPLE) ×2 IMPLANT
SUT CHROMIC 3 0 SH 27 (SUTURE) IMPLANT
SUT CHROMIC 4 0 PS 2 18 (SUTURE) ×4 IMPLANT
SUT ETHILON 3 0 PS 1 (SUTURE) IMPLANT
SUT ETHILON 5 0 P 3 18 (SUTURE) ×1
SUT NYLON ETHILON 5-0 P-3 1X18 (SUTURE) ×1 IMPLANT
SUT SILK 3 0 REEL (SUTURE) IMPLANT
SUT SILK 4 0 REEL (SUTURE) ×3 IMPLANT
TOWEL OR 17X24 6PK STRL BLUE (TOWEL DISPOSABLE) ×2 IMPLANT
TOWEL OR 17X26 10 PK STRL BLUE (TOWEL DISPOSABLE) ×2 IMPLANT
TRAY ENT MC OR (CUSTOM PROCEDURE TRAY) ×2 IMPLANT
WATER STERILE IRR 1000ML POUR (IV SOLUTION) IMPLANT

## 2012-09-15 NOTE — Op Note (Signed)
OPERATIVE REPORT  DATE OF SURGERY: 09/15/2012  PATIENT:  Marissa [name],  46 y.o. female  PRE-OPERATIVE DIAGNOSIS:  THYROID CANCER  POST-OPERATIVE DIAGNOSIS:  THYROID CANCER  PROCEDURE:  Procedure(s): THYROIDECTOMY  SURGEON:  Susy Frizzle, MD  ASSISTANTS: Flo Shanks, MD  ANESTHESIA:   General   EBL:  30 ml  DRAINS: Round J-P drain  LOCAL MEDICATIONS USED:  None  SPECIMEN:  none  COUNTS:  Correct  PROCEDURE DETAILS: The patient was taken to the operating room and placed on the operating table in the supine position. A shoulder roll was placed beneath the shoulder blades and the neck was extended. The neck was prepped and draped in a standard fashion. The previous scar was used, and was incised with a cautery. Dissection was continued down through the platysma layer. Subplatysmal flaps were elevated superiorly to the thyroid cartilage and inferiorly to the clavicle. 2 Gelpi retractors were used throughout the case.  The midline fascia was divided. The strap muscles were reflected laterally on the left side exposing the left thyroid lobe. It was retracted with Allis clamps and brought anterior and inferior. Dissection continued around the capsule of the gland. The middle thyroid vein was ligated between clamps and divided. 4-0 silk ties were used throughout the case. As the gland was brought forward the recurrent nerve was identified and then followed inferiorly. A suspected inferior parathyroid was identified and preserved with its blood supply. The inferior vasculature was identified, ligated between clamps and divided. The superior pole was brought down and treated in a similar fashion ligating the vascular attachments. The left lobe was brought off the trachea and sent for pathologic evaluation. An additional 2 small masses were identified just lateral to the midportion of the lobe, and were dissected free of surrounding tissue and sent with the specimen. These were either lymph nodes or  accessory thyroid tissue. The wound was irrigated with saline and electrocautery was used for completion of hemostasis. The drain was placed and secured to the skin on the right side with nylon suture. The midline fascia was reapproximated with chromic suture as was the platysma layer. Subcuticular sutures were used as well and Dermabond was used on the skin. Patient was then awakened, extubated and transferred to recovery in stable condition to   PATIENT DISPOSITION:  To PACU, stable

## 2012-09-15 NOTE — Anesthesia Procedure Notes (Signed)
Procedure Name: Intubation Date/Time: 09/15/2012 10:41 AM Performed by: Jefm Miles E Pre-anesthesia Checklist: Patient identified, Timeout performed, Emergency Drugs available, Suction available and Patient being monitored Patient Keelyn-evaluated:Patient Crystol-evaluated prior to inductionOxygen Delivery Method: Circle system utilized Preoxygenation: Pre-oxygenation with 100% oxygen Intubation Type: IV induction and Rapid sequence Ventilation: Mask ventilation without difficulty Laryngoscope Size: Mac and 3 Grade View: Grade I Tube type: Oral Tube size: 7.0 mm Number of attempts: 1 Airway Equipment and Method: Stylet Placement Confirmation: ETT inserted through vocal cords under direct vision,  breath sounds checked- equal and bilateral and positive ETCO2 Secured at: 21 cm Tube secured with: Tape Dental Injury: Teeth and Oropharynx as per pre-operative assessment

## 2012-09-15 NOTE — Anesthesia Preprocedure Evaluation (Addendum)
Anesthesia Evaluation  Patient identified by MRN, date of birth, ID band Patient awake    Reviewed: Allergy & Precautions, H&P , NPO status , Patient's Chart, lab work & pertinent test results  Airway Mallampati: II  Neck ROM: full    Dental  (+) Dental Advisory Given, Poor Dentition and Missing,    Pulmonary          Cardiovascular     Neuro/Psych    GI/Hepatic   Endo/Other  Thyroid cancer  Renal/GU      Musculoskeletal   Abdominal   Peds  Hematology   Anesthesia Other Findings   Reproductive/Obstetrics                          Anesthesia Physical Anesthesia Plan  ASA: II  Anesthesia Plan: General   Post-op Pain Management:    Induction: Intravenous  Airway Management Planned: Oral ETT  Additional Equipment:   Intra-op Plan:   Post-operative Plan: Extubation in OR  Informed Consent: I have reviewed the patients History and Physical, chart, labs and discussed the procedure including the risks, benefits and alternatives for the proposed anesthesia with the patient or authorized representative who has indicated his/her understanding and acceptance.     Plan Discussed with: CRNA and Surgeon  Anesthesia Plan Comments:         Anesthesia Quick Evaluation

## 2012-09-15 NOTE — H&P (Signed)
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 Reason For Visit  Po from surgery. Discussed  Here for followup, doing very well from the surgery. Her voice is normal. The scar is healing nicely. I peeled off a lot of the Dermabond. We discussed the results of the first surgery with the gentleman who is interpreting. 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 I have recommended that we perform a completion thyroidectomy. She wants to check on her insurance before scheduling. Allergies  No Known Drug Allergies. Current Meds  No Reported Medications;; RPT. Active Problems  Neoplasm of thyroid  (239.7) (D44.0). Signature  Electronically signed by : Serena Colonel  M.D.; 08/26/2012 10:22 AM EST.  Assessment  . 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 Discussed  Right-sided thyroid mass, slowly enlarging over the last 6 years or so. Recommend thyroid ultrasound. We'll discuss possible need for FNA following that. Reason For Visit  Thi Imelda is here today at the kind request of none for consultation and opinion for lump on neck and painful. HPI  Immigrant from [place], has been here for about 6 or 7 years. Over the past 5 or 6 years, she's had a slowly enlarging mass in the right anterior lower neck area. She has no trouble swallowing and no other symptoms or health problems. Allergies  No Known Drug Allergies. Current Meds  No Reported Medications;; RPT. Personal Hx  Never A Smoker Never Drank Alcohol. ROS  12 system ROS was obtained and reviewed on the Health Maintenance form dated today.  Positive responses are shown above.  If the symptom is not checked, the patient has denied it. Vital Signs   Recorded by [facility] on 13 May 2012 01:03 PM BP:102/64,  Height: 61.5 in, Weight: 124 lb, BMI: 23.1 kg/m2,  BSA Calculated: 1.55 ,  BMI Calculated: 23.05. Physical Exam  APPEARANCE: Well  developed, well nourished, in no acute distress.  Normal affect, in a pleasant mood.  Oriented to time, place and person. COMMUNICATION: Normal voice   HEAD & FACE:  No scars, lesions or masses of head and face.  Sinuses nontender to palpation.  Salivary glands without mass or tenderness.  Facial strength symmetric.  No facial lesion, scars, or mass. EYES: EOMI with normal primary gaze alignment. Visual acuity grossly intact.  PERRLA EXTERNAL EAR & NOSE: No scars, lesions or masses  EAC & TYMPANIC MEMBRANE:  EAC shows no obstructing lesions or debris and tympanic membranes are normal bilaterally with good movement to insufflation. GROSS HEARING: Normal  TMJ:  Nontender  INTRANASAL EXAM: No polyps or purulence.  NASOPHARYNX: Normal, without lesions. LIPS, TEETH & GUMS: No lip lesions, normal dentition and normal gums. ORAL CAVITY/OROPHARYNX:  Oral mucosa moist without lesion or asymmetry of the palate, tongue, tonsil or posterior pharynx. LARYNX (mirror exam):  No lesions of the epiglottis, false cord or TVC's and cords move well to phonation. HYPOPHARYNX (mirror exam): No lesions, asymmetry or pooling of secretions. NECK:  Supple without adenopathy or mass. THYROID: 2 cm firm mass in the right upper pole of the thyroid. No other masses palpable.  NEUROLOGIC:  No gross CN deficits. No nystagmus noted.   LYMPHATIC:  No enlarged nodes palpable. Signature  Electronically signed by : Serena Colonel  M.D.; 05/13/2012 1:21 PM EST.

## 2012-09-15 NOTE — Preoperative (Signed)
Beta Blockers   Reason not to administer Beta Blockers:Not Applicable 

## 2012-09-15 NOTE — Progress Notes (Signed)
After explaining to family members that pt needs to "pee" by 2200 they informed me that she did urinate at 1700 hrs. No documentation was found but will monitor urine out-put Ilean Skill LPN

## 2012-09-15 NOTE — Transfer of Care (Signed)
Immediate Anesthesia Transfer of Care Note  Patient: Marissa [name]  Procedure(s) Performed: Procedure(s) (LRB) with comments: THYROIDECTOMY (Left) - COMPLETION OF THYROIDECTOMY  Patient Location: PACU  Anesthesia Type:General  Level of Consciousness: awake, oriented and patient cooperative  Airway & Oxygen Therapy: Patient Spontanous Breathing and Patient connected to nasal cannula oxygen  Post-op Assessment: Report given to PACU RN  Post vital signs: Reviewed and stable  Complications: No apparent anesthesia complications

## 2012-09-15 NOTE — Progress Notes (Signed)
   ENT Progress Note: s/p Procedure(s): THYROIDECTOMY   Subjective: C/O sore throat  Objective: Vital signs in last 24 hours: Temp:  [97.3 F (36.3 C)-97.6 F (36.4 C)] 97.5 F (36.4 C) (12/11 1410) Pulse Rate:  [69-99] 74  (12/11 1410) Resp:  [13-26] 16  (12/11 1410) BP: (103-129)/(65-70) 108/69 mmHg (12/11 1410) SpO2:  [97 %-100 %] 97 % (12/11 1410) Weight:  [54.8 kg (120 lb 13 oz)] 54.8 kg (120 lb 13 oz) (12/11 1621) Weight change:     Intake/Output from previous day:   Intake/Output this shift: Total I/O In: 800 [I.V.:800] Out: 60 [Drains:10; Blood:50]  Labs:  [facility] 09/14/12 1342  WBC 5.6  HGB 12.1  HCT 37.8  PLT 170    Basename 09/15/12 1514  NA --  K --  CL --  CO2 --  GLUCOSE --  BUN --  CALCIUM 8.7    Studies/Results: No results found.   PHYSICAL EXAM: Inc intact Min drain outpt No swelling   Assessment/Plan: Pain meds adjusted Monitor JP outpt and Ca levels    Marissa [name] 09/15/2012, 5:40 PM

## 2012-09-15 NOTE — Anesthesia Postprocedure Evaluation (Signed)
Anesthesia Post Note  Patient: Marissa [name]  Procedure(s) Performed: Procedure(s) (LRB): THYROIDECTOMY (Left)  Anesthesia type: General  Patient location: PACU  Post pain: Pain level controlled and Adequate analgesia  Post assessment: Post-op Vital signs reviewed, Patient's Cardiovascular Status Stable, Respiratory Function Stable, Patent Airway and Pain level controlled  Last Vitals:  Filed Vitals:   09/15/12 1343  BP:   Pulse:   Temp: 36.3 C  Resp:     Post vital signs: Reviewed and stable  Level of consciousness: awake, alert  and oriented  Complications: No apparent anesthesia complications

## 2012-09-16 ENCOUNTER — Encounter (HOSPITAL_COMMUNITY): Payer: Self-pay | Admitting: General Practice

## 2012-09-16 LAB — CALCIUM: Calcium: 8.1 mg/dL — ABNORMAL LOW (ref 8.4–10.5)

## 2012-09-16 NOTE — Discharge Summary (Signed)
Physician Discharge Summary  Patient ID: Marissa [name] MRN: [id number] DOB/AGE: 1966/08/23 46 y.o.  Admit date: 09/15/2012 Discharge date: 09/16/2012  Admission Diagnoses: Thyroid cancer  Discharge Diagnoses:  Active Problems:  * No active hospital problems. *    Discharged Condition: good  Hospital Course: no complications  Consults: none  Significant Diagnostic Studies: none  Treatments: surgery: completion thyroidectomy  Discharge Exam: Blood pressure 95/59, pulse 70, temperature 98.1 F (36.7 C), temperature source Oral, resp. rate 18, height 5\' 1"  (1.549 m), weight 120 lb 13 oz (54.8 kg), SpO2 99.00%. PHYSICAL EXAM: Incision and voice excellent. JP removed.  Disposition: 01-Home or Self Care     Medication List     As of 09/16/2012 12:30 PM    TAKE these medications         HYDROcodone-acetaminophen 7.5-325 MG per tablet   Commonly known as: NORCO   Take 1 tablet by mouth every 6 (six) hours as needed for pain.      levothyroxine 100 MCG tablet   Commonly known as: SYNTHROID, LEVOTHROID   Take 1 tablet (100 mcg total) by mouth daily.      promethazine 25 MG suppository   Commonly known as: PHENERGAN   Place 1 suppository (25 mg total) rectally every 6 (six) hours as needed for nausea.           Follow-up Information    Follow up with Serena Colonel, MD. Schedule an appointment as soon as possible for a visit in 1 week.   Contact information:   [address], [address] [place], [place] [address] [place] [address] [phone]          Signed: Serena Colonel 09/16/2012, 12:30 PM

## 2012-09-16 NOTE — Progress Notes (Signed)
UR completed 

## 2012-09-16 NOTE — Progress Notes (Signed)
Discharge instructions reviewed with pt and family member and prescriptions given.  Family member and [employer] used for translations.  Pt and family member verbalized understanding and questions answered.  Pt discharged in stable condition via wheelchair with family.  Marissa [name]

## 2012-09-17 ENCOUNTER — Encounter (HOSPITAL_COMMUNITY): Payer: Self-pay | Admitting: Otolaryngology

## 2012-11-04 ENCOUNTER — Other Ambulatory Visit (HOSPITAL_COMMUNITY): Payer: Self-pay | Admitting: Endocrinology

## 2012-11-04 DIAGNOSIS — C73 Malignant neoplasm of thyroid gland: Secondary | ICD-10-CM

## 2012-11-12 ENCOUNTER — Encounter (HOSPITAL_COMMUNITY)
Admission: RE | Admit: 2012-11-12 | Discharge: 2012-11-12 | Disposition: A | Discharge status: Home / Self Care | Payer: BC Managed Care – PPO | Source: Ambulatory Visit | Attending: Endocrinology | Admitting: Endocrinology

## 2012-11-12 DIAGNOSIS — C73 Malignant neoplasm of thyroid gland: Secondary | ICD-10-CM | POA: Insufficient documentation

## 2012-11-12 MED ORDER — SODIUM IODIDE I 131 CAPSULE
3.0000 | Freq: Once | INTRAVENOUS | Status: AC | PRN
  Administered 2012-11-12: 3 via ORAL

## 2012-11-15 ENCOUNTER — Encounter (HOSPITAL_COMMUNITY)
Admission: RE | Admit: 2012-11-15 | Discharge: 2012-11-15 | Disposition: A | Discharge status: Home / Self Care | Payer: BC Managed Care – PPO | Source: Ambulatory Visit | Attending: Endocrinology | Admitting: Endocrinology

## 2012-11-22 ENCOUNTER — Encounter (HOSPITAL_COMMUNITY)
Admission: RE | Admit: 2012-11-22 | Discharge: 2012-11-22 | Disposition: A | Discharge status: Home / Self Care | Payer: BC Managed Care – PPO | Source: Ambulatory Visit | Attending: Endocrinology | Admitting: Endocrinology

## 2012-11-22 DIAGNOSIS — C73 Malignant neoplasm of thyroid gland: Secondary | ICD-10-CM

## 2012-11-22 LAB — HCG, SERUM, QUALITATIVE: Preg, Serum: NEGATIVE

## 2012-12-02 ENCOUNTER — Encounter (HOSPITAL_COMMUNITY)
Admission: RE | Admit: 2012-12-02 | Discharge: 2012-12-02 | Disposition: A | Discharge status: Home / Self Care | Payer: BC Managed Care – PPO | Source: Ambulatory Visit | Attending: Endocrinology | Admitting: Endocrinology

## 2012-12-02 DIAGNOSIS — C73 Malignant neoplasm of thyroid gland: Secondary | ICD-10-CM

## 2013-02-18 ENCOUNTER — Emergency Department (HOSPITAL_COMMUNITY): Payer: BC Managed Care – PPO

## 2013-02-18 ENCOUNTER — Encounter (HOSPITAL_COMMUNITY): Payer: Self-pay | Admitting: Emergency Medicine

## 2013-02-18 ENCOUNTER — Inpatient Hospital Stay (HOSPITAL_COMMUNITY)
Admission: EM | Admit: 2013-02-18 | Discharge: 2013-02-21 | DRG: 300 | Disposition: A | Discharge status: Home / Self Care | Payer: BC Managed Care – PPO | Attending: Internal Medicine | Admitting: Internal Medicine

## 2013-02-18 DIAGNOSIS — E039 Hypothyroidism, unspecified: Secondary | ICD-10-CM | POA: Diagnosis present

## 2013-02-18 DIAGNOSIS — I313 Pericardial effusion (noninflammatory): Secondary | ICD-10-CM | POA: Diagnosis present

## 2013-02-18 DIAGNOSIS — E038 Other specified hypothyroidism: Principal | ICD-10-CM | POA: Diagnosis present

## 2013-02-18 DIAGNOSIS — C801 Malignant (primary) neoplasm, unspecified: Secondary | ICD-10-CM

## 2013-02-18 DIAGNOSIS — Z85850 Personal history of malignant neoplasm of thyroid: Secondary | ICD-10-CM

## 2013-02-18 DIAGNOSIS — Z9119 Patient's noncompliance with other medical treatment and regimen: Secondary | ICD-10-CM

## 2013-02-18 DIAGNOSIS — I319 Disease of pericardium, unspecified: Secondary | ICD-10-CM

## 2013-02-18 DIAGNOSIS — R1310 Dysphagia, unspecified: Secondary | ICD-10-CM | POA: Diagnosis present

## 2013-02-18 DIAGNOSIS — E876 Hypokalemia: Secondary | ICD-10-CM | POA: Diagnosis present

## 2013-02-18 DIAGNOSIS — Z91199 Patient's noncompliance with other medical treatment and regimen due to unspecified reason: Secondary | ICD-10-CM

## 2013-02-18 DIAGNOSIS — Z79899 Other long term (current) drug therapy: Secondary | ICD-10-CM

## 2013-02-18 DIAGNOSIS — I3139 Other pericardial effusion (noninflammatory): Secondary | ICD-10-CM | POA: Diagnosis present

## 2013-02-18 DIAGNOSIS — D61818 Other pancytopenia: Secondary | ICD-10-CM | POA: Diagnosis present

## 2013-02-18 LAB — CBC WITH DIFFERENTIAL/PLATELET
Basophils Absolute: 0 10*3/uL (ref 0.0–0.1)
Basophils Relative: 1 % (ref 0–1)
Eosinophils Absolute: 0.1 10*3/uL (ref 0.0–0.7)
Eosinophils Relative: 3 % (ref 0–5)
HCT: 33 % — ABNORMAL LOW (ref 36.0–46.0)
MCHC: 33 g/dL (ref 30.0–36.0)
MCV: 75.3 fL — ABNORMAL LOW (ref 78.0–100.0)
Monocytes Absolute: 0.1 10*3/uL (ref 0.1–1.0)
RDW: 15.7 % — ABNORMAL HIGH (ref 11.5–15.5)

## 2013-02-18 LAB — URINALYSIS, ROUTINE W REFLEX MICROSCOPIC
Glucose, UA: NEGATIVE mg/dL
Hgb urine dipstick: NEGATIVE
Specific Gravity, Urine: 1.012 (ref 1.005–1.030)
Urobilinogen, UA: 0.2 mg/dL (ref 0.0–1.0)

## 2013-02-18 LAB — COMPREHENSIVE METABOLIC PANEL
AST: 71 U/L — ABNORMAL HIGH (ref 0–37)
Albumin: 4.6 g/dL (ref 3.5–5.2)
Calcium: 8.8 mg/dL (ref 8.4–10.5)
Creatinine, Ser: 1.07 mg/dL (ref 0.50–1.10)
GFR calc non Af Amer: 62 mL/min — ABNORMAL LOW (ref >90)
Total Protein: 8 g/dL (ref 6.0–8.3)

## 2013-02-18 LAB — POCT PREGNANCY, URINE: Preg Test, Ur: NEGATIVE

## 2013-02-18 LAB — CREATININE, SERUM
Creatinine, Ser: 1.05 mg/dL (ref 0.50–1.10)
GFR calc non Af Amer: 63 mL/min — ABNORMAL LOW (ref >90)

## 2013-02-18 LAB — CBC
Hemoglobin: 11.2 g/dL — ABNORMAL LOW (ref 12.0–15.0)
MCHC: 33 g/dL (ref 30.0–36.0)
Platelets: 118 10*3/uL — ABNORMAL LOW (ref 150–400)
RDW: 16.1 % — ABNORMAL HIGH (ref 11.5–15.5)

## 2013-02-18 LAB — SEDIMENTATION RATE: Sed Rate: 15 mm/hr (ref 0–22)

## 2013-02-18 MED ORDER — SODIUM CHLORIDE 0.9 % IJ SOLN
3.0000 mL | Freq: Two times a day (BID) | INTRAMUSCULAR | Status: DC
  Administered 2013-02-18 – 2013-02-20 (×5): 3 mL via INTRAVENOUS

## 2013-02-18 MED ORDER — BISACODYL 5 MG PO TBEC: 10.0000 mg | DELAYED_RELEASE_TABLET | Freq: Every day | ORAL | Status: DC | PRN

## 2013-02-18 MED ORDER — ACETAMINOPHEN 325 MG PO TABS: 650.0000 mg | ORAL_TABLET | Freq: Four times a day (QID) | ORAL | Status: DC | PRN

## 2013-02-18 MED ORDER — ONDANSETRON HCL 4 MG PO TABS: 4.0000 mg | ORAL_TABLET | Freq: Four times a day (QID) | ORAL | Status: DC | PRN

## 2013-02-18 MED ORDER — ONDANSETRON HCL 4 MG/2ML IJ SOLN: 4.0000 mg | Freq: Four times a day (QID) | INTRAMUSCULAR | Status: DC | PRN

## 2013-02-18 MED ORDER — ENOXAPARIN SODIUM 40 MG/0.4ML ~~LOC~~ SOLN
40.0000 mg | SUBCUTANEOUS | Status: DC
  Administered 2013-02-18 – 2013-02-20 (×3): 40 mg via SUBCUTANEOUS
  Filled 2013-02-18 (×4): qty 0.4

## 2013-02-18 MED ORDER — LEVOTHYROXINE SODIUM 100 MCG PO TABS
100.0000 ug | ORAL_TABLET | Freq: Every day | ORAL | Status: DC
  Administered 2013-02-19 – 2013-02-21 (×3): 100 ug via ORAL
  Filled 2013-02-18 (×4): qty 1

## 2013-02-18 MED ORDER — POTASSIUM CHLORIDE CRYS ER 20 MEQ PO TBCR
40.0000 meq | EXTENDED_RELEASE_TABLET | Freq: Once | ORAL | Status: AC
  Administered 2013-02-18: 40 meq via ORAL
  Filled 2013-02-18: qty 2

## 2013-02-18 MED ORDER — HYDROCODONE-ACETAMINOPHEN 5-325 MG PO TABS: 1.0000 | ORAL_TABLET | Freq: Four times a day (QID) | ORAL | Status: DC | PRN

## 2013-02-18 MED ORDER — IOHEXOL 350 MG/ML SOLN
100.0000 mL | Freq: Once | INTRAVENOUS | Status: AC | PRN
  Administered 2013-02-18: 100 mL via INTRAVENOUS

## 2013-02-18 NOTE — ED Notes (Addendum)
Pt family interpreting over speaker phone.

## 2013-02-18 NOTE — ED Notes (Signed)
Pt reports she has ran out of her synthroid. Did not f/u with PCP

## 2013-02-18 NOTE — ED Provider Notes (Signed)
History     CSN: [id number]  Arrival date & time 02/18/13  4098   First MD Initiated Contact with Patient 02/18/13 [phone]      Chief Complaint  Patient presents with  . Sore Throat    (Consider location/radiation/quality/duration/timing/severity/associated sxs/prior treatment) HPI  47 year old [place] female presents to the emergency department with chief complaint of sore throat. Patient speaks dialect of [place]  Interpreter phone is used.interpreter and the patient speak in non-native dialect which they both appear to be able to communicate. The history is limited due to this fact. Patient is status post thyroidectomy performed in December of 2013.  Patient with NM scan in January of 2014 should residual thyroid tissue in the neck with out metastatic concern. She complains of pain at the surgical incision site on her neck.  She denies any pain with swallowing, difficulty eating or swallowing foods or liquids.  He also complains of cold intolerance, severe fatigue, shortness of breath, facial swelling.  The patient was discharged from Dr. Lucky Rathke care in January.  Patient states he has not been taking her thyroid medication.  She was told to follow up with her primary care physician, however the patient states she does not know who her primary care physician is or where he is located and is therefore not had any followup..  Concerning her dyspnea she denies any chest pain, pressure or, nausea, vomiting, diaphoresis.  Patient appears to feel that she cannot get a "full breath." She denies fever, soaking night sweats, unintentional weight loss.The patient has been taking Tylenol which helps relieve some of her surgical site pain.  She denies any symptoms of upper respiratory infection appear  Denies fevers, chills, myalgias, arthralgias. Denies DOE, SOB, chest tightness or pressure, radiation to left arm, jaw or back, or diaphoresis. Denies dysuria, flank pain, suprapubic pain, frequency,  urgency, or hematuria. Denies headaches, visual disturbances. Denies abdominal pain, nausea, vomiting, diarrhea.     Past Medical History  Diagnosis Date  . Cancer     THYROID   CANCER    Past Surgical History  Procedure Laterality Date  . Thyroidectomy  07/23/2012    Procedure: THYROIDECTOMY;  Surgeon: Serena Colonel, MD;  Location: [facility] OR;  Service: ENT;  Laterality: Right;  Right thyroid lobectomy possible total   . Thyroidectomy  09/15/2012    Procedure: THYROIDECTOMY;  Surgeon: Serena Colonel, MD;  Location: [facility] OR;  Service: ENT;  Laterality: Left;  COMPLETION OF THYROIDECTOMY    No family history on file.  History  Substance Use Topics  . Smoking status: Never Smoker   . Smokeless tobacco: Never Used  . Alcohol Use: No    OB History   Grav Para Term Preterm Abortions TAB SAB Ect Mult Living                  Review of Systems Ten systems reviewed and are negative for acute change, except as noted in the HPI.   Allergies  Review of patient's allergies indicates no known allergies.  Home Medications   Current Outpatient Rx  Name  Route  Sig  Dispense  Refill  . HYDROcodone-acetaminophen (NORCO) 7.5-325 MG per tablet   Oral   Take 1 tablet by mouth every 6 (six) hours as needed for pain.   30 tablet   0   . levothyroxine (SYNTHROID) 100 MCG tablet   Oral   Take 1 tablet (100 mcg total) by mouth daily.   30 tablet   6   .  promethazine (PHENERGAN) 25 MG suppository   Rectal   Place 1 suppository (25 mg total) rectally every 6 (six) hours as needed for nausea.   12 each   0     BP 123/85  Pulse 65  Temp(Src) 97.6 F (36.4 C) (Oral)  Resp 14  SpO2 98%  Physical Exam  Nursing note and vitals reviewed. Constitutional: She is oriented to person, place, and time. She appears well-developed and well-nourished. No distress.  Patient resting comfortably on examining table.  She is wearing multiple layers of clothing.  HENT:  Head: Normocephalic and  atraumatic.  Eyes: Conjunctivae and EOM are normal. Pupils are equal, round, and reactive to light. No scleral icterus.  Mild periorbital edema.  Neck: Normal range of motion. Neck supple. No JVD present. No tracheal deviation present.  Well-healing surgical scar at the base of the neck.  It is tender to palpation.  No warmth erythema induration or discharge noted.  Cardiovascular: Normal rate, regular rhythm and normal heart sounds.  Exam reveals no gallop and no friction rub.   No murmur heard. No peripheral edema no pretibial myxedema  Pulmonary/Chest: Effort normal and breath sounds normal. No respiratory distress. She has no wheezes. She exhibits no tenderness.  Abdominal: Soft. Bowel sounds are normal. She exhibits no distension and no mass. There is no tenderness. There is no guarding.  Musculoskeletal: Normal range of motion.  Lymphadenopathy:    She has no cervical adenopathy.  Neurological: She is alert and oriented to person, place, and time.  Skin: Skin is warm and dry. She is not diaphoretic.    ED Course  Procedures (including critical care time)  Labs Reviewed - No data to display Dg Chest 2 View  02/18/2013   *RADIOLOGY REPORT*  Clinical Data: Short of breath.  Thyroid cancer  CHEST - 2 VIEW  Comparison: 07/19/2012  Findings: 3 cm right paratracheal lymph node is similar to the prior study.  No other adenopathy.  No infiltrate or effusion. Cardiac enlargement without heart failure.  No peripheral lung nodule.  IMPRESSION: 3 cm right paratracheal lymph nodes, unchanged.  CT chest with contrast is suggested for further evaluation.  This could represent a neoplastic process including metastatic disease from thyroid cancer.   Original Report Authenticated By: Janeece Riggers, M.D.   Ct Soft Tissue Neck W Contrast  02/18/2013   *RADIOLOGY REPORT*  Clinical Data: Pain in the neck and throat.  Difficulty breathing. Thyroidectomy in December 2013.  CT NECK WITH CONTRAST  Technique:   Multidetector CT imaging of the neck was performed with intravenous contrast.  Contrast: OMNIPAQUE IOHEXOL 350 MG/ML SOLN  Comparison: Nuclear medicine examinations earlier this year. Ultrasound 05/14/2012.  Findings: Limited visualization of the intracranial contents does not show any abnormality.  No parotid lesions.  Submandibular glands are normal.  There has been previous thyroidectomy.  There is some scarring in the thyroid bed.  I cannot discern any specific tumor or mass.  There are no pathologically enlarged lymph nodes on either side of the neck.  No evidence of fluid collection.  There are normal sized level II nodes bilaterally.  Ordinary degenerative changes effect the spine.  No mucosal or submucosal lesion is seen. No lesions seen obstructing the airway or the esophagus.  See results of the chest examination for description of disease in the chest.  IMPRESSION: Previous thyroidectomy.  No evidence of gross tumor or adenopathy in the neck.  No obstructive lesion of the airway or esophagus. See results  of chest CT for pathology in that region.   Original Report Authenticated By: Paulina Fusi, M.D.   Ct Angio Chest Pe W/cm &/or Wo Cm  02/18/2013   *RADIOLOGY REPORT*  Clinical Data: Shortness of breath, neck and throat pain  CT ANGIOGRAPHY CHEST  Technique:  Multidetector CT imaging of the chest using the standard protocol during bolus administration of intravenous contrast. Multiplanar reconstructed images including MIPs were obtained and reviewed to evaluate the vascular anatomy.  Contrast: OMNIPAQUE IOHEXOL 350 MG/ML SOLN  Comparison: 02/18/2013  Findings: No significant filling defect or pulmonary embolus by the CTA.  Moderate pericardial effusion.  Reflux of contrast into the IVC and hepatic veins compatible with right heart strain versus elevated right heart pressures.  Chronic right paratracheal nodal mass with calcification, suspect remote granulomatous disease.  No upper abdominal  acute finding.  Lung windows demonstrate decreased lung volumes with scattered nonspecific ground-glass opacities and basilar atelectasis.  No pneumonia, airspace process, collapse or consolidation.  No acute osseous finding.  IMPRESSION: Negative for acute pulmonary embolus.  Moderate pericardial effusion with evidence of elevated right heart pressures with refluxing contrast into the IVC and hepatic veins.  Chronic right paratracheal calcified nodal mass suspect remote granulomatous disease.  Low lung volumes with atelectasis   Original Report Authenticated By: Judie Petit. Shick, M.D.     1. Pericardial effusion        MDM  10:47 AM Filed Vitals:   02/18/13 0844  BP: 123/85  Pulse: 65  Temp: 97.6 F (36.4 C)  Resp: 14   Patient offered pain medication.  She states she does not need any at this time. We will obtain a chest x-ray basic labs and CT scan of the soft tissues of the neck. And was likely due to taking her thyroid medications.  Patient stable at this time.;    3:05 PM Patient with moderate pericardial effusion.  I have spoken with Dr. Suanne Marker who has agreed to admit the patient.  [facility] will consult . I have placed an ordere for 2D echo with contrast in hopes that it will be performed this Friday evening. The patient appears reasonably stabilized for admission considering the current resources, flow, and capabilities available in the ED at this time, and I doubt any other [facility] requiring further screening and/or treatment in the ED prior to admission.    Arthor Captain, PA-C 02/18/13 1727

## 2013-02-18 NOTE — ED Notes (Signed)
Attempted to give report. Nurse is unavailable, nurse to call back in .

## 2013-02-18 NOTE — ED Notes (Signed)
In contact with interpreter line. Pts language is not available through translation interpretor service. Trying to contact pt family member that speaks [place].

## 2013-02-18 NOTE — ED Notes (Signed)
Pt in CT.

## 2013-02-18 NOTE — Consult Note (Signed)
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 She does not speak english, therefore her history is obtained by use of interpreter and through chart review. She has no established cardiologist. Her past medical history is significant for thyroid cancer. She is s/p thyroidectomy, done in December 2013. She was placed on levothyroxine, but apparently has been noncompliant and has not had follow-up care since her procedure. Work-up in the ED included a CT of the chest, which showed moderate pericardial effusion with evidence of elevated right heart pressures with refluxing contrast into the IVC and hepatic veins. The patient denies chest pain. She reports mild SOB, throat pain and difficulty speaking. She has noticed increased fatigue x 2 weeks.    Past Medical History  Diagnosis Date  . Cancer     THYROID   CANCER    Past Surgical History  Procedure Laterality Date  . Thyroidectomy  07/23/2012    Procedure: THYROIDECTOMY;  Surgeon: Serena Colonel, MD;  Location: [facility] OR;  Service: ENT;  Laterality: Right;  Right thyroid lobectomy possible total   . Thyroidectomy  09/15/2012    Procedure: THYROIDECTOMY;  Surgeon: Serena Colonel, MD;  Location: [facility] OR;  Service: ENT;  Laterality: Left;  COMPLETION OF THYROIDECTOMY    No family history on file.  Social History:  reports that she has never smoked. She has never used smokeless tobacco. She reports that she does not drink alcohol or use illicit drugs.  Allergies: No Known Allergies  Medications: Prior to Admission:  (Not in a hospital admission)  Results for orders placed during the hospital encounter of 02/18/13 (from the past 48 hour(s))  CBC WITH  DIFFERENTIAL     Status: Abnormal   Collection Time    02/18/13 10:47 AM      Result Value Range   WBC 2.4 (*) 4.0 - 10.5 K/uL   RBC 4.38  3.87 - 5.11 MIL/uL   Hemoglobin 10.9 (*) 12.0 - 15.0 g/dL   HCT 45.4 (*) 09.8 - 11.9 %   MCV 75.3 (*) 78.0 - 100.0 fL   MCH 24.9 (*) 26.0 - 34.0 pg   MCHC 33.0  30.0 - 36.0 g/dL   RDW 14.7 (*) 82.9 - 56.2 %   Platelets 138 (*) 150 - 400 K/uL   Comment: PLATELET COUNT CONFIRMED BY SMEAR   Neutrophils Relative % 53  43 - 77 %   Neutro Abs 1.3 (*) 1.7 - 7.7 K/uL   Lymphocytes Relative 39  12 - 46 %   Lymphs Abs 0.9  0.7 - 4.0 K/uL   Monocytes Relative 4  3 - 12 %   Monocytes Absolute 0.1  0.1 - 1.0 K/uL   Eosinophils Relative 3  0 - 5 %   Eosinophils Absolute 0.1  0.0 - 0.7 K/uL   Basophils Relative 1  0 - 1 %   Basophils Absolute 0.0  0.0 - 0.1 K/uL  COMPREHENSIVE METABOLIC PANEL     Status: Abnormal   Collection Time    02/18/13 10:47 AM      Result Value Range   Sodium 140  135 - 145 mEq/L   Potassium 3.4 (*) 3.5 -  5.1 mEq/L   Chloride 103  96 - 112 mEq/L   CO2 29  19 - 32 mEq/L   Glucose, Bld 82  70 - 99 mg/dL   BUN 13  6 - 23 mg/dL   Creatinine, Ser 1.61  0.50 - 1.10 mg/dL   Calcium 8.8  8.4 - 09.6 mg/dL   Total Protein 8.0  6.0 - 8.3 g/dL   Albumin 4.6  3.5 - 5.2 g/dL   AST 71 (*) 0 - 37 U/L   ALT 75 (*) 0 - 35 U/L   Alkaline Phosphatase 82  39 - 117 U/L   Total Bilirubin 0.8  0.3 - 1.2 mg/dL   GFR calc non Af Amer 62 (*) >90 mL/min   GFR calc Af Amer 72 (*) >90 mL/min   Comment:            The eGFR has been calculated     using the CKD EPI equation.     This calculation has not been     validated in all clinical     situations.     eGFR's persistently     <90 mL/min signify     possible Chronic Kidney Disease.  URINALYSIS, ROUTINE W REFLEX MICROSCOPIC     Status: None   Collection Time    02/18/13 11:34 AM      Result Value Range   Color, Urine YELLOW  YELLOW   APPearance CLEAR  CLEAR   Specific Gravity, Urine 1.012   1.005 - 1.030   pH 7.5  5.0 - 8.0   Glucose, UA NEGATIVE  NEGATIVE mg/dL   Hgb urine dipstick NEGATIVE  NEGATIVE   Bilirubin Urine NEGATIVE  NEGATIVE   Ketones, ur NEGATIVE  NEGATIVE mg/dL   Protein, ur NEGATIVE  NEGATIVE mg/dL   Urobilinogen, UA 0.2  0.0 - 1.0 mg/dL   Nitrite NEGATIVE  NEGATIVE   Leukocytes, UA NEGATIVE  NEGATIVE   Comment: MICROSCOPIC NOT DONE ON URINES WITH NEGATIVE PROTEIN, BLOOD, LEUKOCYTES, NITRITE, OR GLUCOSE <1000 mg/dL.  POCT PREGNANCY, URINE     Status: None   Collection Time    02/18/13 11:38 AM      Result Value Range   Preg Test, Ur NEGATIVE  NEGATIVE   Comment:            THE SENSITIVITY OF THIS     METHODOLOGY IS >24 mIU/mL    Dg Chest 2 View  02/18/2013   *RADIOLOGY REPORT*  Clinical Data: Short of breath.  Thyroid cancer  CHEST - 2 VIEW  Comparison: 07/19/2012  Findings: 3 cm right paratracheal lymph node is similar to the prior study.  No other adenopathy.  No infiltrate or effusion. Cardiac enlargement without heart failure.  No peripheral lung nodule.  IMPRESSION: 3 cm right paratracheal lymph nodes, unchanged.  CT chest with contrast is suggested for further evaluation.  This could represent a neoplastic process including metastatic disease from thyroid cancer.   Original Report Authenticated By: Janeece Riggers, M.D.   Ct Soft Tissue Neck W Contrast  02/18/2013   *RADIOLOGY REPORT*  Clinical Data: Pain in the neck and throat.  Difficulty breathing. Thyroidectomy in December 2013.  CT NECK WITH CONTRAST  Technique:  Multidetector CT imaging of the neck was performed with intravenous contrast.  Contrast: OMNIPAQUE IOHEXOL 350 MG/ML SOLN  Comparison: Nuclear medicine examinations earlier this year. Ultrasound 05/14/2012.  Findings: Limited visualization of the intracranial contents does not show any abnormality.  No parotid lesions.  Submandibular glands are normal.  There has been previous thyroidectomy.  There is some scarring in the thyroid bed.  I  cannot discern any specific tumor or mass.  There are no pathologically enlarged lymph nodes on either side of the neck.  No evidence of fluid collection.  There are normal sized level II nodes bilaterally.  Ordinary degenerative changes effect the spine.  No mucosal or submucosal lesion is seen. No lesions seen obstructing the airway or the esophagus.  See results of the chest examination for description of disease in the chest.  IMPRESSION: Previous thyroidectomy.  No evidence of gross tumor or adenopathy in the neck.  No obstructive lesion of the airway or esophagus. See results of chest CT for pathology in that region.   Original Report Authenticated By: Paulina Fusi, M.D.   Ct Angio Chest Pe W/cm &/or Wo Cm  02/18/2013   *RADIOLOGY REPORT*  Clinical Data: Shortness of breath, neck and throat pain  CT ANGIOGRAPHY CHEST  Technique:  Multidetector CT imaging of the chest using the standard protocol during bolus administration of intravenous contrast. Multiplanar reconstructed images including MIPs were obtained and reviewed to evaluate the vascular anatomy.  Contrast: OMNIPAQUE IOHEXOL 350 MG/ML SOLN  Comparison: 02/18/2013  Findings: No significant filling defect or pulmonary embolus by the CTA.  Moderate pericardial effusion.  Reflux of contrast into the IVC and hepatic veins compatible with right heart strain versus elevated right heart pressures.  Chronic right paratracheal nodal mass with calcification, suspect remote granulomatous disease.  No upper abdominal acute finding.  Lung windows demonstrate decreased lung volumes with scattered nonspecific ground-glass opacities and basilar atelectasis.  No pneumonia, airspace process, collapse or consolidation.  No acute osseous finding.  IMPRESSION: Negative for acute pulmonary embolus.  Moderate pericardial effusion with evidence of elevated right heart pressures with refluxing contrast into the IVC and hepatic veins.  Chronic right paratracheal calcified  nodal mass suspect remote granulomatous disease.  Low lung volumes with atelectasis   Original Report Authenticated By: Judie Petit. Miles Costain, M.D.    Review of Systems  Constitutional: Positive for malaise/fatigue.  Respiratory: Positive for shortness of breath.   Cardiovascular: Negative for chest pain and leg swelling.   Blood pressure 115/76, pulse 58, temperature 97.6 F (36.4 C), temperature source Oral, resp. rate 12, SpO2 100.00%. Physical Exam  Constitutional: She is oriented to person, place, and time. She appears well-developed and well-nourished. No distress.  HENT:  Head: Normocephalic and atraumatic.  Eyes: Conjunctivae and EOM are normal. Pupils are equal, round, and reactive to light.  Neck: No thyromegaly present.  Cardiovascular: Normal rate, regular rhythm, normal heart sounds and intact distal pulses.  Exam reveals no gallop and no friction rub.   No murmur heard. Pulses:      Radial pulses are 2+ on the right side, and 2+ on the left side.       Dorsalis pedis pulses are 2+ on the right side, and 2+ on the left side.  Respiratory: Effort normal and breath sounds normal. No respiratory distress. She has no wheezes. She has no rales.  Musculoskeletal: She exhibits no edema.  Lymphadenopathy:    She has no cervical adenopathy.  Neurological: She is alert and oriented to person, place, and time.  Skin: Skin is warm and dry. She is not diaphoretic.  Psychiatric: She has a normal mood and affect. Her behavior is normal.    Assessment/Plan: Active Problems:   Pericardial effusion   Hypothyroidism (acquired)   Hypokalemia  Pancytopenia  Plan: 47 y/o [place] female with hx of thyroid cancer, s/p thyroidectomy in 2013, admitted for pericardial effusion, found on CT scan. TRH is admitting. No chest pain. She is fairly asymptomatic, with the exception of increased fatigue x 2 weeks and mild SOB. She is hemodynamically stable. She is anemic with H/H of 10.9/33.0. 2D echo has been  ordered. If significant effusion noted on echo, will need to consider pericardiocentesis vs pericardial window. Continue to follow vitals closely and watch for signs of hemodynamic compromise. She is hypokalemic with K+ of 3.4. May need to replete intravenously if patient is having difficulty swallowing. MD to follow with further recommendation.   Allayne Butcher, PA-C 02/18/2013, 4:11 PM    I have seen and examined the patient along with BRITTAINY M. SIMMONS, PA-C.  I have reviewed the chart, notes and new data.  I agree with PA's note.  Able to communicate with the patient and her husband via an interpreter on the phone, Mr. Doreatha Martin, [phone]. The patient has been identified as Mr. Sam as his "brother". It sounds like he provides translation support to the [employer], and members of the community identify him as "brother". He offered to translate in the future as well.   Key new complaints: She does not have the typical complaints of pericardial tamponade but does describe some of the complaints typical of myxedema such as a hoarse voice. It sounds like she has not taken thyroid hormone supplementation in the 5 months since her total thyroidectomy Key examination changes: Her jugular venous pulsations are not elevated and I cannot elicit hepatojugular reflux, she does not have pulses paradoxus. 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 Review of the CT of the chest it does confirm the presence of a pericardial effusion. Unfortunately this is located almost entirely posterior to the left ventricle and is unlikely to be amenable to pericardiocentesis. There is indeed reflux of contrast into the inferior vena cava suggesting elevated right atrial pressure. 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 If this is the case and the echocardiogram does not show signs of impending tamponade, then the treatment will simply be to reintroduce thyroid hormone supplementation.  If thyroid function studies are not markedly abnormal then further workup of the pericardial fluid is probably indicated. She does have a very large paratracheal lymph nodes which might indicate a granulomatous disorder and even tuberculosis, although there are no pulmonary abnormalities to support this diagnosis.   She also has pancytopenia and consideration should be given to collagen vascular disorder such as systemic lupus erythematosus, other multisystem disorders such as sarcoidosis or even widespread malignancy.  Unfortunately fluid sampling is unlikely to be safely performed with percutaneous methods. An ESR may be helpful. If elevated it would support possible infectious, neoplastic or autoimmune etiology. If normal it would be more in keeping with myxedema.   Thurmon Fair, MD, [facility] [phone] 02/18/2013, 6:44 PM

## 2013-02-18 NOTE — ED Notes (Addendum)
Pt c/o neck/throat pain and is here to "continue with her pain medications". Pt reports breathing is not comfortable sometimes. Pain has gotten better since surgery, thyroidectomy, in 09/2012. Pressure with breathing. Pt speaking native language without difficulty. No SOB, NAD noted.  No pain with swallowing or breathing. No sweats, no wt loss, no fever.   Interpretors phone #: [phone]

## 2013-02-18 NOTE — H&P (Addendum)
[facility] History and Physical  Debbra Thi ZOX:[id number] DOB: 03/08/1966 DOA: 02/18/2013  Referring physician:Dr MCmanus PCP: Dorisann Frames, MD  Specialists:   Chief Complaint: increased fatigue   HPI: Marissa [name] is a 47 y.o. female with history significant for thyroid cancer and status post thyroidectomy 09/2012 presents with above complaints.she is a [place] his, non-English-speaking and the history is obtained through her son who is interpreting at the bedside. She states that she ran out of her Synthroid/medications 3 months ago and for the past 2 weeks she's had increasing fatigue and felt like her voice was 'different' and so she came to the ED so she could get her medication as prescribed.she also states that she has had some facial swelling. she denies constipation, temperature intolerance, and no hair loss she/coarse hair and no leg swelling. She denies chest pain, and when asked more about the shortness of breath noted earlier per EDP she states she just feels mostly tired.she was seen in the ED anda chest x-ray showed a 3 cm right paratracheal lymph node,CT of soft tissue neck showed no evidence of gross tumor or adenopathy.CT angio of the chest was done and was negative for PE moderate pericardial effusion with evidence of elevated right heart pressures with reflux of contrast into the IVC and hepatic veins was noted andcardiology was consulted per EDP and admission to [facility] requested.   Review of Systems: The patient denies anorexia, fever, weight loss,, vision loss, decreased hearing, hoarseness, chest pain, syncope, dyspnea on exertion, peripheral edema, balance deficits, hemoptysis, abdominal pain, melena, hematochezia, severe indigestion/heartburn, hematuria, incontinence, genital sores, muscle weakness, suspicious skin lesions, transient blindness, difficulty walking, depression, unusual weight change, abnormal bleeding, enlarged lymph nodes, angioedema, and breast masses.     Past Medical History  Diagnosis Date  . Cancer     THYROID   CANCER   Past Surgical History  Procedure Laterality Date  . Thyroidectomy  07/23/2012    Procedure: THYROIDECTOMY;  Surgeon: Serena Colonel, MD;  Location: [facility] OR;  Service: ENT;  Laterality: Right;  Right thyroid lobectomy possible total   . Thyroidectomy  09/15/2012    Procedure: THYROIDECTOMY;  Surgeon: Serena Colonel, MD;  Location: [facility] OR;  Service: ENT;  Laterality: Left;  COMPLETION OF THYROIDECTOMY   Social History:  reports that she has never smoked. She has never used smokeless tobacco. She reports that she does not drink alcohol or use illicit drugs. where does patient live--home Can patient participate in ADLs-yes  No Known Allergies  Family history - She states the rest of her family is in [place] and no known history of cancer or any other medical illnesses.-  Prior to Admission medications   Medication Sig Start Date End Date Taking? Authorizing Provider  acetaminophen (TYLENOL) 325 MG tablet Take 650 mg by mouth every 6 (six) hours.   Yes Historical Provider, MD   Physical Exam: Filed Vitals:   02/18/13 1120 02/18/13 1423 02/18/13 1636 02/18/13 1645  BP: 115/84 115/76 116/75 111/77  Pulse: 60 58 56 55  Temp:      TempSrc: Oral     Resp: 14 12 18 15   SpO2: 98% 100% 100% 100%    Constitutional: Vital signs reviewed.  Patient is a well-developed and well-nourished  in no acute distress and cooperative with exam. Alert and oriented x3.  Head: Normocephalic and atraumatic, face appears puffy Ear: TM normal bilaterally Mouth: no erythema or exudates, MMM Eyes: PERRL, EOMI, conjunctivae normal, No scleral icterus.  Neck: Supple, Trachea  midline normal ROM, No JVD, mass, thyromegaly, or carotid bruit present.well-healed anterior and cervical incision  Cardiovascular: regular, mildly bradycardic, S1 normal, S2 normal, no MRG, pulses symmetric and intact bilaterally Pulmonary/Chest: CTAB, no wheezes, rales, or  rhonchi Abdominal: Soft. Non-tender, non-distended, bowel sounds are normal, no masses, organomegaly, or guarding present.  GU: no CVA tenderness Extremities: trace pretibial edema, No cyanosis Neurological: A&O x3, Strength is normal and symmetric bilaterally, cranial nerve II-XII are grossly intact, no focal motor deficit, sensory intact to light touch bilaterally.  Skin: Warm, dry and intact. No rash, cyanosis, or clubbing.  Psychiatric: Normal mood and affect. speech and behavior is normal.  Labs on Admission:  Basic Metabolic Panel:  Recent Labs Lab 02/18/13 1047  NA 140  K 3.4*  CL 103  CO2 29  GLUCOSE 82  BUN 13  CREATININE 1.07  CALCIUM 8.8   Liver Function Tests:  Recent Labs Lab 02/18/13 1047  AST 71*  ALT 75*  ALKPHOS 82  BILITOT 0.8  PROT 8.0  ALBUMIN 4.6   No results found for this basename: LIPASE, AMYLASE,  in the last 168 hours No results found for this basename: AMMONIA,  in the last 168 hours CBC:  Recent Labs Lab 02/18/13 1047  WBC 2.4*  NEUTROABS 1.3*  HGB 10.9*  HCT 33.0*  MCV 75.3*  PLT 138*   Cardiac Enzymes: No results found for this basename: CKTOTAL, CKMB, CKMBINDEX, TROPONINI,  in the last 168 hours  BNP (last 3 results) No results found for this basename: PROBNP,  in the last 8760 hours CBG: No results found for this basename: GLUCAP,  in the last 168 hours  Radiological Exams on Admission: Dg Chest 2 View  02/18/2013   *RADIOLOGY REPORT*  Clinical Data: Short of breath.  Thyroid cancer  CHEST - 2 VIEW  Comparison: 07/19/2012  Findings: 3 cm right paratracheal lymph node is similar to the prior study.  No other adenopathy.  No infiltrate or effusion. Cardiac enlargement without heart failure.  No peripheral lung nodule.  IMPRESSION: 3 cm right paratracheal lymph nodes, unchanged.  CT chest with contrast is suggested for further evaluation.  This could represent a neoplastic process including metastatic disease from thyroid cancer.    Original Report Authenticated By: Janeece Riggers, M.D.   Ct Soft Tissue Neck W Contrast  02/18/2013   *RADIOLOGY REPORT*  Clinical Data: Pain in the neck and throat.  Difficulty breathing. Thyroidectomy in December 2013.  CT NECK WITH CONTRAST  Technique:  Multidetector CT imaging of the neck was performed with intravenous contrast.  Contrast: OMNIPAQUE IOHEXOL 350 MG/ML SOLN  Comparison: Nuclear medicine examinations earlier this year. Ultrasound 05/14/2012.  Findings: Limited visualization of the intracranial contents does not show any abnormality.  No parotid lesions.  Submandibular glands are normal.  There has been previous thyroidectomy.  There is some scarring in the thyroid bed.  I cannot discern any specific tumor or mass.  There are no pathologically enlarged lymph nodes on either side of the neck.  No evidence of fluid collection.  There are normal sized level II nodes bilaterally.  Ordinary degenerative changes effect the spine.  No mucosal or submucosal lesion is seen. No lesions seen obstructing the airway or the esophagus.  See results of the chest examination for description of disease in the chest.  IMPRESSION: Previous thyroidectomy.  No evidence of gross tumor or adenopathy in the neck.  No obstructive lesion of the airway or esophagus. See results of chest CT  for pathology in that region.   Original Report Authenticated By: Paulina Fusi, M.D.   Ct Angio Chest Pe W/cm &/or Wo Cm  02/18/2013   *RADIOLOGY REPORT*  Clinical Data: Shortness of breath, neck and throat pain  CT ANGIOGRAPHY CHEST  Technique:  Multidetector CT imaging of the chest using the standard protocol during bolus administration of intravenous contrast. Multiplanar reconstructed images including MIPs were obtained and reviewed to evaluate the vascular anatomy.  Contrast: OMNIPAQUE IOHEXOL 350 MG/ML SOLN  Comparison: 02/18/2013  Findings: No significant filling defect or pulmonary embolus by the CTA.  Moderate  pericardial effusion.  Reflux of contrast into the IVC and hepatic veins compatible with right heart strain versus elevated right heart pressures.  Chronic right paratracheal nodal mass with calcification, suspect remote granulomatous disease.  No upper abdominal acute finding.  Lung windows demonstrate decreased lung volumes with scattered nonspecific ground-glass opacities and basilar atelectasis.  No pneumonia, airspace process, collapse or consolidation.  No acute osseous finding.  IMPRESSION: Negative for acute pulmonary embolus.  Moderate pericardial effusion with evidence of elevated right heart pressures with refluxing contrast into the IVC and hepatic veins.  Chronic right paratracheal calcified nodal mass suspect remote granulomatous disease.  Low lung volumes with atelectasis   Original Report Authenticated By: Judie Petit. Miles Costain, M.D.     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 -as discussed above presenting with some facial swelling,pericardial effusion, has been noncompliant with her Synthroid for at least 3mos -obtain TSH and free T4 , Will resume Synthroid follow further manage accordingly   Hypokalemia -replace k   Pancytopenia -mild, in patient with history of thyroid cancer a follow recheck History of thyroid cancer -s/p thyroidectomy    Code Status: full Family Communication: husband and son at bedside Disposition Plan: admit to tele  Time spent:>30  Kela Millin [facility] Pager [phone]  If 7PM-7AM, please contact night-coverage [email or web address] Password [facility] 02/18/2013, 6:08 PM

## 2013-02-19 ENCOUNTER — Other Ambulatory Visit: Payer: Self-pay

## 2013-02-19 DIAGNOSIS — I319 Disease of pericardium, unspecified: Secondary | ICD-10-CM

## 2013-02-19 DIAGNOSIS — D61818 Other pancytopenia: Secondary | ICD-10-CM

## 2013-02-19 DIAGNOSIS — E876 Hypokalemia: Secondary | ICD-10-CM

## 2013-02-19 DIAGNOSIS — E039 Hypothyroidism, unspecified: Secondary | ICD-10-CM

## 2013-02-19 LAB — CBC
MCH: 24.7 pg — ABNORMAL LOW (ref 26.0–34.0)
MCHC: 32.9 g/dL (ref 30.0–36.0)
RDW: 15.8 % — ABNORMAL HIGH (ref 11.5–15.5)

## 2013-02-19 LAB — BASIC METABOLIC PANEL
BUN: 15 mg/dL (ref 6–23)
Calcium: 9.8 mg/dL (ref 8.4–10.5)
Creatinine, Ser: 1.13 mg/dL — ABNORMAL HIGH (ref 0.50–1.10)
GFR calc Af Amer: 67 mL/min — ABNORMAL LOW (ref >90)
GFR calc non Af Amer: 58 mL/min — ABNORMAL LOW (ref >90)

## 2013-02-19 LAB — T4, FREE: Free T4: 0.3 ng/dL — ABNORMAL LOW (ref 0.80–1.80)

## 2013-02-19 LAB — TSH: TSH: 107.605 u[IU]/mL — ABNORMAL HIGH (ref 0.350–4.500)

## 2013-02-19 MED ORDER — PHENOL 1.4 % MT LIQD
1.0000 | OROMUCOSAL | Status: DC | PRN
  Administered 2013-02-19: 1 via OROMUCOSAL
  Filled 2013-02-19: qty 177

## 2013-02-19 NOTE — Progress Notes (Signed)
[facility]  DAILY PROGRESS NOTE   Subjective:  Dysphagia, hoarseness, no dyspnea.  Objective:  Temp:  [97.9 F (36.6 C)-98.8 F (37.1 C)] 98.8 F (37.1 C) (05/17 0543) Pulse Rate:  [55-61] 61 (05/17 0543) Resp:  [12-19] 18 (05/17 0543) BP: (103-127)/(69-94) 106/72 mmHg (05/17 0543) SpO2:  [97 %-100 %] 98 % (05/17 0543) Weight:  [60 kg (132 lb 4.4 oz)] 60 kg (132 lb 4.4 oz) (05/16 2100) Weight change:   Intake/Output from previous day:    Intake/Output from this shift:    Medications: Current Facility-Administered Medications  Medication Dose Route Frequency Provider Last Rate Last Dose  . acetaminophen (TYLENOL) tablet 650 mg  650 mg Oral Q6H PRN Adeline C Viyuoh, MD      . bisacodyl (DULCOLAX) EC tablet 10 mg  10 mg Oral Daily PRN Adeline C Viyuoh, MD      . enoxaparin (LOVENOX) injection 40 mg  40 mg Subcutaneous Q24H Adeline C Viyuoh, MD   40 mg at 02/18/13 2140  . HYDROcodone-acetaminophen (NORCO/VICODIN) 5-325 MG per tablet 1-2 tablet  1-2 tablet Oral Q6H PRN Adeline C Viyuoh, MD      . levothyroxine (SYNTHROID, LEVOTHROID) tablet 100 mcg  100 mcg Oral QAC breakfast Kela Millin, MD   100 mcg at 02/19/13 0753  . ondansetron (ZOFRAN) tablet 4 mg  4 mg Oral Q6H PRN Adeline C Viyuoh, MD       Or  . ondansetron (ZOFRAN) injection 4 mg  4 mg Intravenous Q6H PRN Adeline C Viyuoh, MD      . phenol (CHLORASEPTIC) mouth spray 1 spray  1 spray Mouth/Throat PRN Clydia Llano, MD      . sodium chloride 0.9 % injection 3 mL  3 mL Intravenous Q12H Kela Millin, MD   3 mL at 02/19/13 1014    Physical Exam: General appearance: alert, cooperative, slowed mentation and syndromic appearance - myxedema Neck: no adenopathy, no carotid bruit, no JVD, supple, symmetrical, trachea midline and thyroid not enlarged, symmetric, no tenderness/mass/nodules Lungs: clear to auscultation bilaterally Heart: regular rate and rhythm, S1, S2 normal, no murmur,  click, rub or gallop Abdomen: soft, non-tender; bowel sounds normal; no masses,  no organomegaly Extremities: extremities normal, atraumatic, no cyanosis or edema Pulses: 2+ and symmetric Skin: coarse, doughy Neurologic: Reflexes: 2+ and symmetric delayed   Lab Results: Results for orders placed during the hospital encounter of 02/18/13 (from the past 48 hour(s))  CBC WITH DIFFERENTIAL     Status: Abnormal   Collection Time    02/18/13 10:47 AM      Result Value Range   WBC 2.4 (*) 4.0 - 10.5 K/uL   RBC 4.38  3.87 - 5.11 MIL/uL   Hemoglobin 10.9 (*) 12.0 - 15.0 g/dL   HCT 95.2 (*) 84.1 - 32.4 %   MCV 75.3 (*) 78.0 - 100.0 fL   MCH 24.9 (*) 26.0 - 34.0 pg   MCHC 33.0  30.0 - 36.0 g/dL   RDW 40.1 (*) 02.7 - 25.3 %   Platelets 138 (*) 150 - 400 K/uL   Comment: PLATELET COUNT CONFIRMED BY SMEAR   Neutrophils Relative % 53  43 - 77 %   Neutro Abs 1.3 (*) 1.7 - 7.7 K/uL   Lymphocytes Relative 39  12 - 46 %   Lymphs Abs 0.9  0.7 - 4.0 K/uL   Monocytes Relative 4  3 - 12 %   Monocytes Absolute 0.1  0.1 - 1.0  K/uL   Eosinophils Relative 3  0 - 5 %   Eosinophils Absolute 0.1  0.0 - 0.7 K/uL   Basophils Relative 1  0 - 1 %   Basophils Absolute 0.0  0.0 - 0.1 K/uL  COMPREHENSIVE METABOLIC PANEL     Status: Abnormal   Collection Time    02/18/13 10:47 AM      Result Value Range   Sodium 140  135 - 145 mEq/L   Potassium 3.4 (*) 3.5 - 5.1 mEq/L   Chloride 103  96 - 112 mEq/L   CO2 29  19 - 32 mEq/L   Glucose, Bld 82  70 - 99 mg/dL   BUN 13  6 - 23 mg/dL   Creatinine, Ser 1.91  0.50 - 1.10 mg/dL   Calcium 8.8  8.4 - 47.8 mg/dL   Total Protein 8.0  6.0 - 8.3 g/dL   Albumin 4.6  3.5 - 5.2 g/dL   AST 71 (*) 0 - 37 U/L   ALT 75 (*) 0 - 35 U/L   Alkaline Phosphatase 82  39 - 117 U/L   Total Bilirubin 0.8  0.3 - 1.2 mg/dL   GFR calc non Af Amer 62 (*) >90 mL/min   GFR calc Af Amer 72 (*) >90 mL/min   Comment:            The eGFR has been calculated     using the CKD EPI equation.      This calculation has not been     validated in all clinical     situations.     eGFR's persistently     <90 mL/min signify     possible Chronic Kidney Disease.  URINALYSIS, ROUTINE W REFLEX MICROSCOPIC     Status: None   Collection Time    02/18/13 11:34 AM      Result Value Range   Color, Urine YELLOW  YELLOW   APPearance CLEAR  CLEAR   Specific Gravity, Urine 1.012  1.005 - 1.030   pH 7.5  5.0 - 8.0   Glucose, UA NEGATIVE  NEGATIVE mg/dL   Hgb urine dipstick NEGATIVE  NEGATIVE   Bilirubin Urine NEGATIVE  NEGATIVE   Ketones, ur NEGATIVE  NEGATIVE mg/dL   Protein, ur NEGATIVE  NEGATIVE mg/dL   Urobilinogen, UA 0.2  0.0 - 1.0 mg/dL   Nitrite NEGATIVE  NEGATIVE   Leukocytes, UA NEGATIVE  NEGATIVE   Comment: MICROSCOPIC NOT DONE ON URINES WITH NEGATIVE PROTEIN, BLOOD, LEUKOCYTES, NITRITE, OR GLUCOSE <1000 mg/dL.  POCT PREGNANCY, URINE     Status: None   Collection Time    02/18/13 11:38 AM      Result Value Range   Preg Test, Ur NEGATIVE  NEGATIVE   Comment:            THE SENSITIVITY OF THIS     METHODOLOGY IS >24 mIU/mL  TSH     Status: Abnormal   Collection Time    02/18/13  6:16 PM      Result Value Range   TSH 107.605 (*) 0.350 - 4.500 uIU/mL  T4, FREE     Status: Abnormal   Collection Time    02/18/13  6:16 PM      Result Value Range   Free T4 0.30 (*) 0.80 - 1.80 ng/dL  SEDIMENTATION RATE     Status: None   Collection Time    02/18/13  7:37 PM      Result Value Range  Sed Rate 15  0 - 22 mm/hr  CBC     Status: Abnormal   Collection Time    02/18/13  7:37 PM      Result Value Range   WBC 3.0 (*) 4.0 - 10.5 K/uL   RBC 4.49  3.87 - 5.11 MIL/uL   Hemoglobin 11.2 (*) 12.0 - 15.0 g/dL   HCT 16.1 (*) 09.6 - 04.5 %   MCV 75.5 (*) 78.0 - 100.0 fL   MCH 24.9 (*) 26.0 - 34.0 pg   MCHC 33.0  30.0 - 36.0 g/dL   RDW 40.9 (*) 81.1 - 91.4 %   Platelets 118 (*) 150 - 400 K/uL   Comment: REPEATED TO VERIFY     SPECIMEN CHECKED FOR CLOTS     PLATELETS APPEAR  DECREASED     PLATELET COUNT CONFIRMED BY SMEAR  CREATININE, SERUM     Status: Abnormal   Collection Time    02/18/13  7:37 PM      Result Value Range   Creatinine, Ser 1.05  0.50 - 1.10 mg/dL   GFR calc non Af Amer 63 (*) >90 mL/min   GFR calc Af Amer 73 (*) >90 mL/min   Comment:            The eGFR has been calculated     using the CKD EPI equation.     This calculation has not been     validated in all clinical     situations.     eGFR's persistently     <90 mL/min signify     possible Chronic Kidney Disease.  BASIC METABOLIC PANEL     Status: Abnormal   Collection Time    02/19/13  6:45 AM      Result Value Range   Sodium 140  135 - 145 mEq/L   Potassium 3.9  3.5 - 5.1 mEq/L   Chloride 104  96 - 112 mEq/L   CO2 23  19 - 32 mEq/L   Glucose, Bld 85  70 - 99 mg/dL   BUN 15  6 - 23 mg/dL   Creatinine, Ser 7.82 (*) 0.50 - 1.10 mg/dL   Calcium 9.8  8.4 - 95.6 mg/dL   GFR calc non Af Amer 58 (*) >90 mL/min   GFR calc Af Amer 67 (*) >90 mL/min   Comment:            The eGFR has been calculated     using the CKD EPI equation.     This calculation has not been     validated in all clinical     situations.     eGFR's persistently     <90 mL/min signify     possible Chronic Kidney Disease.  CBC     Status: Abnormal   Collection Time    02/19/13  6:45 AM      Result Value Range   WBC 3.4 (*) 4.0 - 10.5 K/uL   RBC 4.57  3.87 - 5.11 MIL/uL   Hemoglobin 11.3 (*) 12.0 - 15.0 g/dL   HCT 21.3 (*) 08.6 - 57.8 %   MCV 75.1 (*) 78.0 - 100.0 fL   MCH 24.7 (*) 26.0 - 34.0 pg   MCHC 32.9  30.0 - 36.0 g/dL   RDW 46.9 (*) 62.9 - 52.8 %   Platelets 129 (*) 150 - 400 K/uL    Imaging: Dg Chest 2 View  02/18/2013   *RADIOLOGY REPORT*  Clinical Data: Short of breath.  Thyroid cancer  CHEST - 2 VIEW  Comparison: 07/19/2012  Findings: 3 cm right paratracheal lymph node is similar to the prior study.  No other adenopathy.  No infiltrate or effusion. Cardiac enlargement without heart  failure.  No peripheral lung nodule.  IMPRESSION: 3 cm right paratracheal lymph nodes, unchanged.  CT chest with contrast is suggested for further evaluation.  This could represent a neoplastic process including metastatic disease from thyroid cancer.   Original Report Authenticated By: Janeece Riggers, M.D.   Ct Soft Tissue Neck W Contrast  02/18/2013   *RADIOLOGY REPORT*  Clinical Data: Pain in the neck and throat.  Difficulty breathing. Thyroidectomy in December 2013.  CT NECK WITH CONTRAST  Technique:  Multidetector CT imaging of the neck was performed with intravenous contrast.  Contrast: OMNIPAQUE IOHEXOL 350 MG/ML SOLN  Comparison: Nuclear medicine examinations earlier this year. Ultrasound 05/14/2012.  Findings: Limited visualization of the intracranial contents does not show any abnormality.  No parotid lesions.  Submandibular glands are normal.  There has been previous thyroidectomy.  There is some scarring in the thyroid bed.  I cannot discern any specific tumor or mass.  There are no pathologically enlarged lymph nodes on either side of the neck.  No evidence of fluid collection.  There are normal sized level II nodes bilaterally.  Ordinary degenerative changes effect the spine.  No mucosal or submucosal lesion is seen. No lesions seen obstructing the airway or the esophagus.  See results of the chest examination for description of disease in the chest.  IMPRESSION: Previous thyroidectomy.  No evidence of gross tumor or adenopathy in the neck.  No obstructive lesion of the airway or esophagus. See results of chest CT for pathology in that region.   Original Report Authenticated By: Paulina Fusi, M.D.   Ct Angio Chest Pe W/cm &/or Wo Cm  02/18/2013   *RADIOLOGY REPORT*  Clinical Data: Shortness of breath, neck and throat pain  CT ANGIOGRAPHY CHEST  Technique:  Multidetector CT imaging of the chest using the standard protocol during bolus administration of intravenous contrast. Multiplanar  reconstructed images including MIPs were obtained and reviewed to evaluate the vascular anatomy.  Contrast: OMNIPAQUE IOHEXOL 350 MG/ML SOLN  Comparison: 02/18/2013  Findings: No significant filling defect or pulmonary embolus by the CTA.  Moderate pericardial effusion.  Reflux of contrast into the IVC and hepatic veins compatible with right heart strain versus elevated right heart pressures.  Chronic right paratracheal nodal mass with calcification, suspect remote granulomatous disease.  No upper abdominal acute finding.  Lung windows demonstrate decreased lung volumes with scattered nonspecific ground-glass opacities and basilar atelectasis.  No pneumonia, airspace process, collapse or consolidation.  No acute osseous finding.  IMPRESSION: Negative for acute pulmonary embolus.  Moderate pericardial effusion with evidence of elevated right heart pressures with refluxing contrast into the IVC and hepatic veins.  Chronic right paratracheal calcified nodal mass suspect remote granulomatous disease.  Low lung volumes with atelectasis   Original Report Authenticated By: Judie Petit. Miles Costain, M.D.    Assessment:  1. Active Problems: 2.   Pericardial effusion 3.   Hypothyroidism (acquired) 4.   Hypokalemia 5.   Pancytopenia 6.   Plan:  1. She has myxedema related pericardial effusion and this will resolve gradually with thyroid hormone supplementation. Echo pending, but clinically no evidence of tamponade. Pericardiocentesis is not indicated.  2. Microcytic anemia is not typical with hypothyroidism and may require additional workup. 3. Will review echo, but doubt that there will  be findings that change the plan of care. Please Adley-consult for additional questions.  Time Spent Directly with Patient:  30 minutes  Length of Stay:  LOS: 1 day    Vrishank Moster 02/19/2013, 11:25 AM

## 2013-02-19 NOTE — Progress Notes (Signed)
[facility] PROGRESS NOTE  Marissa [name]:[id number] DOB: [birth year] DOA: 02/18/2013 PCP: Dorisann Frames, MD  HPI/Subjective: Speaks very little [place], daughter at bedside provided translation. Daughter at bedside provided translation between [place] and [place]  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 -TSH should be checked back again in 4-6 weeks as outpatient. We will insure good followup  Pericardial effusion -Likely secondary to myxedema. Check 2-D echo on 12-lead EKG. -From the CT scan seems to have early tamponade physiology. -Cardiology evaluated the patient, recommended no procedural intervention. -Cardiology recommended treatment of myxedema Synthroid and close followup.  Hypokalemia  -Replace potassium  Pancytopenia  -mild, in patient with history of thyroid cancer a follow recheck , ESR is normal  History of thyroid cancer  -s/p thyroidectomy   Code Status: Full Family Communication: Spoke with patient and her daughter at bedside. Disposition Plan: Remain inpatient   Consultants:  [facility]  Procedures:  None  Antibiotics:  None    Objective: Filed Vitals:   02/18/13 1900 02/18/13 2100 02/19/13 0543 02/19/13 1418  BP: 116/77 106/69 106/72 110/77  Pulse: 59 55 61 64  Temp: 97.9 F (36.6 C) 98.3 F (36.8 C) 98.8 F (37.1 C) 98.7 F (37.1 C)  TempSrc: Oral  Oral Oral  Resp: 18 18 18 18   Height:  5' (1.524 m)    Weight:  60 kg (132 lb 4.4 oz)    SpO2: 97% 97% 98% 97%   No intake or output data in the 24 hours ending 02/19/13 1431 Filed Weights   02/18/13 2100  Weight: 60 kg (132 lb 4.4 oz)    Exam:  General: Alert and awake, oriented x3, not in any acute distress. HEENT: anicteric sclera, pupils reactive to light and accommodation, EOMI CVS: S1-S2 clear,  no murmur rubs or gallops Chest: clear to auscultation bilaterally, no wheezing, rales or rhonchi Abdomen: soft nontender, nondistended, normal bowel sounds, no organomegaly Extremities: no cyanosis, clubbing or edema noted bilaterally Neuro: Cranial nerves II-XII intact, no focal neurological deficits  Data Reviewed: Basic Metabolic Panel:  Recent Labs Lab 02/18/13 1047 02/18/13 1937 02/19/13 0645  NA 140  --  140  K 3.4*  --  3.9  CL 103  --  104  CO2 29  --  23  GLUCOSE 82  --  85  BUN 13  --  15  CREATININE 1.07 1.05 1.13*  CALCIUM 8.8  --  9.8   Liver Function Tests:  Recent Labs Lab 02/18/13 1047  AST 71*  ALT 75*  ALKPHOS 82  BILITOT 0.8  PROT 8.0  ALBUMIN 4.6   No results found for this basename: LIPASE, AMYLASE,  in the last 168 hours No results found for this basename: AMMONIA,  in the last 168 hours CBC:  Recent Labs Lab 02/18/13 1047 02/18/13 1937 02/19/13 0645  WBC 2.4* 3.0* 3.4*  NEUTROABS 1.3*  --   --   HGB 10.9* 11.2* 11.3*  HCT 33.0* 33.9* 34.3*  MCV 75.3* 75.5* 75.1*  PLT 138* 118* 129*   Cardiac Enzymes: No results found for this basename: CKTOTAL, CKMB, CKMBINDEX, TROPONINI,  in the last 168 hours BNP (last 3 results) No results found for this basename: PROBNP,  in the last 8760 hours CBG: No results found for this basename: GLUCAP,  in the last 168 hours  No results found for this or any previous  visit (from the past 240 hour(s)).   Studies: Dg Chest 2 View  02/18/2013   *RADIOLOGY REPORT*  Clinical Data: Short of breath.  Thyroid cancer  CHEST - 2 VIEW  Comparison: 07/19/2012  Findings: 3 cm right paratracheal lymph node is similar to the prior study.  No other adenopathy.  No infiltrate or effusion. Cardiac enlargement without heart failure.  No peripheral lung nodule.  IMPRESSION: 3 cm right paratracheal lymph nodes, unchanged.  CT chest with contrast is suggested for further evaluation.  This could represent a neoplastic process  including metastatic disease from thyroid cancer.   Original Report Authenticated By: Janeece Riggers, M.D.   Ct Soft Tissue Neck W Contrast  02/18/2013   *RADIOLOGY REPORT*  Clinical Data: Pain in the neck and throat.  Difficulty breathing. Thyroidectomy in December 2013.  CT NECK WITH CONTRAST  Technique:  Multidetector CT imaging of the neck was performed with intravenous contrast.  Contrast: OMNIPAQUE IOHEXOL 350 MG/ML SOLN  Comparison: Nuclear medicine examinations earlier this year. Ultrasound 05/14/2012.  Findings: Limited visualization of the intracranial contents does not show any abnormality.  No parotid lesions.  Submandibular glands are normal.  There has been previous thyroidectomy.  There is some scarring in the thyroid bed.  I cannot discern any specific tumor or mass.  There are no pathologically enlarged lymph nodes on either side of the neck.  No evidence of fluid collection.  There are normal sized level II nodes bilaterally.  Ordinary degenerative changes effect the spine.  No mucosal or submucosal lesion is seen. No lesions seen obstructing the airway or the esophagus.  See results of the chest examination for description of disease in the chest.  IMPRESSION: Previous thyroidectomy.  No evidence of gross tumor or adenopathy in the neck.  No obstructive lesion of the airway or esophagus. See results of chest CT for pathology in that region.   Original Report Authenticated By: Paulina Fusi, M.D.   Ct Angio Chest Pe W/cm &/or Wo Cm  02/18/2013   *RADIOLOGY REPORT*  Clinical Data: Shortness of breath, neck and throat pain  CT ANGIOGRAPHY CHEST  Technique:  Multidetector CT imaging of the chest using the standard protocol during bolus administration of intravenous contrast. Multiplanar reconstructed images including MIPs were obtained and reviewed to evaluate the vascular anatomy.  Contrast: OMNIPAQUE IOHEXOL 350 MG/ML SOLN  Comparison: 02/18/2013  Findings: No significant filling defect  or pulmonary embolus by the CTA.  Moderate pericardial effusion.  Reflux of contrast into the IVC and hepatic veins compatible with right heart strain versus elevated right heart pressures.  Chronic right paratracheal nodal mass with calcification, suspect remote granulomatous disease.  No upper abdominal acute finding.  Lung windows demonstrate decreased lung volumes with scattered nonspecific ground-glass opacities and basilar atelectasis.  No pneumonia, airspace process, collapse or consolidation.  No acute osseous finding.  IMPRESSION: Negative for acute pulmonary embolus.  Moderate pericardial effusion with evidence of elevated right heart pressures with refluxing contrast into the IVC and hepatic veins.  Chronic right paratracheal calcified nodal mass suspect remote granulomatous disease.  Low lung volumes with atelectasis   Original Report Authenticated By: Judie Petit. Shick, M.D.    Scheduled Meds: . enoxaparin (LOVENOX) injection  40 mg Subcutaneous Q24H  . levothyroxine  100 mcg Oral QAC breakfast  . sodium chloride  3 mL Intravenous Q12H   Continuous Infusions:   Active Problems:   Pericardial effusion   Hypothyroidism (acquired)   Hypokalemia   Pancytopenia  Time spent: 35 minutes    [facility]  [facility] Pager [phone]. If 7PM-7AM, please contact night-coverage at [email or web address], password [facility] 02/19/2013, 2:31 PM  LOS: 1 day

## 2013-02-19 NOTE — Progress Notes (Signed)
Admitted pt.to 5528,from ED ,bec.of increasing fatigue & different feeling on her throat as per daughter.She is [place] ,speaks very little English.,alert ,oriented x4.Oriented the pt. To the room with the help of daughter &  Call bell & instructed to call the nurse for any help.Placed pt.on heart monitor & verified with Central monitor tech.Will continue to monitor pt.Randell Loop RN

## 2013-02-19 NOTE — Progress Notes (Signed)
  Echocardiogram 2D Echocardiogram has been performed.  Marissa [name] 02/19/2013, 3:33 PM 

## 2013-02-20 LAB — RETICULOCYTES: RBC.: 4.68 MIL/uL (ref 3.87–5.11)

## 2013-02-20 LAB — BASIC METABOLIC PANEL
BUN: 21 mg/dL (ref 6–23)
Chloride: 101 mEq/L (ref 96–112)
GFR calc Af Amer: 55 mL/min — ABNORMAL LOW (ref >90)
Potassium: 3.8 mEq/L (ref 3.5–5.1)
Sodium: 139 mEq/L (ref 135–145)

## 2013-02-20 LAB — CBC
HCT: 35.3 % — ABNORMAL LOW (ref 36.0–46.0)
Hemoglobin: 11.5 g/dL — ABNORMAL LOW (ref 12.0–15.0)
MCHC: 32.6 g/dL (ref 30.0–36.0)
RBC: 4.7 MIL/uL (ref 3.87–5.11)

## 2013-02-20 LAB — IRON AND TIBC
Iron: 132 ug/dL (ref 42–135)
Saturation Ratios: 40 % (ref 20–55)
TIBC: 332 ug/dL (ref 250–470)
UIBC: 200 ug/dL (ref 125–400)

## 2013-02-20 LAB — FOLATE: Folate: 14.5 ng/mL

## 2013-02-20 MED ORDER — POTASSIUM CHLORIDE CRYS ER 20 MEQ PO TBCR
40.0000 meq | EXTENDED_RELEASE_TABLET | Freq: Once | ORAL | Status: AC
  Administered 2013-02-20: 40 meq via ORAL
  Filled 2013-02-20: qty 2

## 2013-02-20 MED ORDER — SODIUM CHLORIDE 0.9 % IV SOLN
INTRAVENOUS | Status: DC
  Administered 2013-02-20 (×2): via INTRAVENOUS

## 2013-02-20 NOTE — ED Provider Notes (Signed)
Medical screening examination/treatment/procedure(s) were performed by non-physician practitioner and as supervising physician I was immediately available for consultation/collaboration.   Laray Anger, DO 02/20/13 2335

## 2013-02-20 NOTE — Consult Note (Signed)
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 Pollyann Kennedy in winter 2013. Admitted to [facility] for shortness of breath so Medicine consulted ENT for laryngoscopy.  Allergies: No Known Allergies  ROS: subjective shortness of breath, otherwise negative x 10 systems except per HPI PMH:  Past Medical History  Diagnosis Date  . Cancer     THYROID   CANCER    FH: History reviewed. No pertinent family history.  SH:  History   Social History  . Marital Status: Married    Spouse Name: N/A    Number of Children: N/A  . Years of Education: N/A   Occupational History  . Not on file.   Social History Main Topics  . Smoking status: Never Smoker   . Smokeless tobacco: Never Used  . Alcohol Use: No  . Drug Use: No  . Sexually Active: Yes   Other Topics Concern  . Not on file   Social History Narrative  . No narrative on file    PSH:  Past Surgical History  Procedure Laterality Date  . Thyroidectomy  07/23/2012    Procedure: THYROIDECTOMY;  Surgeon: Serena Colonel, MD;  [facility];  Service: ENT;  Laterality: Right;  Right thyroid lobectomy possible total   . Thyroidectomy  09/15/2012    Procedure: THYROIDECTOMY;  Surgeon: Serena Colonel, MD;  Location: [facility] OR;  Service: ENT;  Laterality: Left;  COMPLETION OF THYROIDECTOMY    Physical  Exam: CN 2-12 grossly intact and symmetric. EAC/TMs normal BL. Normal voice with no stridor or stertor. Oral cavity, lips, gums, ororpharynx normal with no masses or lesions. Skin warm and dry. Nasal cavity without polyps or purulence. External nose and ears without masses or lesions. EOMI, PERRLA. Neck supple with no masses or lesions. No lymphadenopathy palpated. Thyroid  Surgically absent with well-healed thyroidectomy scar.  Imaging: CT neck reviewed by me shows surgically absent thyroid with  no residual tissue or new masses/lymphadenopathy. The larynx/true vocal folds are well visualized on the CT scan and are completely normal on the neck CT. Essentially normal neck CT.  Procedure Note: [id number] Informed verbal consent was obtained after explaining the risks (including bleeding and infection), benefits and alternatives of the procedure. Verbal timeout was performed prior to the procedure. The nose was topicalized with topical oxymetazoline. The flexible 4mm scope was advanced through the left nasal cavity. The septum and turbinates appeared normal. The middle meatus was free of polyps or purulence. The eustachian tube, choana, and nasopharynx were normal in appearance. The hypopharynx, arytenoids, false vocal folds, and true vocal folds appeared normal with normal true vocal fold adduction and abduction and no masses, lesions, or edema. The visualized portion of the subglottis appeared normal. The patient tolerated the procedure with no immediate complications.  A/P: normal neck CT, normal exam, and completely normal flexible laryngoscopy. Can follow up with Dr. Pollyann Kennedy as needed.   Marissa [name], Marissa [name] 02/20/2013 3:44 PM

## 2013-02-20 NOTE — Progress Notes (Signed)
[facility] PROGRESS NOTE  Marissa [name] ZOX:[id number] DOB: 02-15-1966 DOA: 02/18/2013 PCP: Dorisann Frames, MD  HPI/Subjective: Speaks very little [place], daughter at bedside provided translation. Daughter at bedside provided translation between [place] and [place] Complaining about the hoarseness of voice. CT scan of the neck reviewed, no obvious issues.  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 -TSH should be checked back again in 4-6 weeks as outpatient. We will insure good followup  Hoarseness of voice -CT scan of the neck soft tissues showed no evidence of tumor recurrence. -ENT consulted for further recommendation  Pericardial effusion -Likely secondary to myxedema. Check 2-D echo on 12-lead EKG. -From the CT scan seems to have early tamponade physiology. -Cardiology evaluated the patient, recommended no procedural intervention. -Cardiology recommended treatment of myxedema Synthroid and close followup.  Hypokalemia  -Replace potassium  Pancytopenia  -mild, in patient with history of thyroid cancer a follow recheck , ESR is normal. -She had microcytosis will check an anemia panel. This could be secondary to the myxedema.  History of thyroid cancer  -s/p thyroidectomy   Code Status: Full Family Communication: Spoke with patient and her daughter at bedside. Disposition Plan: Remain inpatient   Consultants:  [facility]  Procedures:  None  Antibiotics:  None  Objective: Filed Vitals:   02/19/13 0543 02/19/13 1418 02/19/13 2200 02/20/13 0600  BP: 106/72 110/77 107/74 92/63  Pulse: 61 64 67 68  Temp: 98.8 F (37.1 C) 98.7 F (37.1 C) 97.3 F (36.3 C) 98.1 F (36.7 C)  TempSrc: Oral Oral Oral Oral  Resp: 18 18 16 16   Height:      Weight:      SpO2: 98% 97% 94% 94%   No  intake or output data in the 24 hours ending 02/20/13 1128 Filed Weights   02/18/13 2100  Weight: 60 kg (132 lb 4.4 oz)    Exam:  General: Alert and awake, oriented x3, not in any acute distress. HEENT: anicteric sclera, pupils reactive to light and accommodation, EOMI CVS: S1-S2 clear, no murmur rubs or gallops Chest: clear to auscultation bilaterally, no wheezing, rales or rhonchi Abdomen: soft nontender, nondistended, normal bowel sounds, no organomegaly Extremities: no cyanosis, clubbing or edema noted bilaterally Neuro: Cranial nerves II-XII intact, no focal neurological deficits  Data Reviewed: Basic Metabolic Panel:  Recent Labs Lab 02/18/13 1047 02/18/13 1937 02/19/13 0645 02/20/13 0633  NA 140  --  140 139  K 3.4*  --  3.9 3.8  CL 103  --  104 101  CO2 29  --  23 26  GLUCOSE 82  --  85 82  BUN 13  --  15 21  CREATININE 1.07 1.05 1.13* 1.32*  CALCIUM 8.8  --  9.8 9.1   Liver Function Tests:  Recent Labs Lab 02/18/13 1047  AST 71*  ALT 75*  ALKPHOS 82  BILITOT 0.8  PROT 8.0  ALBUMIN 4.6   No results found for this basename: LIPASE, AMYLASE,  in the last 168 hours No results found for this basename: AMMONIA,  in the last 168 hours CBC:  Recent Labs Lab 02/18/13 1047 02/18/13 1937 02/19/13 0645 02/20/13 0633  WBC 2.4* 3.0* 3.4* 3.1*  NEUTROABS 1.3*  --   --   --   HGB 10.9* 11.2* 11.3* 11.5*  HCT 33.0* 33.9* 34.3* 35.3*  MCV 75.3* 75.5* 75.1* 75.1*  PLT 138*  118* 129* 139*   Cardiac Enzymes: No results found for this basename: CKTOTAL, CKMB, CKMBINDEX, TROPONINI,  in the last 168 hours BNP (last 3 results) No results found for this basename: PROBNP,  in the last 8760 hours CBG: No results found for this basename: GLUCAP,  in the last 168 hours  No results found for this or any previous visit (from the past 240 hour(s)).   Studies: Ct Soft Tissue Neck W Contrast  02/18/2013   *RADIOLOGY REPORT*  Clinical Data: Pain in the neck and throat.   Difficulty breathing. Thyroidectomy in December 2013.  CT NECK WITH CONTRAST  Technique:  Multidetector CT imaging of the neck was performed with intravenous contrast.  Contrast: OMNIPAQUE IOHEXOL 350 MG/ML SOLN  Comparison: Nuclear medicine examinations earlier this year. Ultrasound 05/14/2012.  Findings: Limited visualization of the intracranial contents does not show any abnormality.  No parotid lesions.  Submandibular glands are normal.  There has been previous thyroidectomy.  There is some scarring in the thyroid bed.  I cannot discern any specific tumor or mass.  There are no pathologically enlarged lymph nodes on either side of the neck.  No evidence of fluid collection.  There are normal sized level II nodes bilaterally.  Ordinary degenerative changes effect the spine.  No mucosal or submucosal lesion is seen. No lesions seen obstructing the airway or the esophagus.  See results of the chest examination for description of disease in the chest.  IMPRESSION: Previous thyroidectomy.  No evidence of gross tumor or adenopathy in the neck.  No obstructive lesion of the airway or esophagus. See results of chest CT for pathology in that region.   Original Report Authenticated By: Paulina Fusi, M.D.   Ct Angio Chest Pe W/cm &/or Wo Cm  02/18/2013   *RADIOLOGY REPORT*  Clinical Data: Shortness of breath, neck and throat pain  CT ANGIOGRAPHY CHEST  Technique:  Multidetector CT imaging of the chest using the standard protocol during bolus administration of intravenous contrast. Multiplanar reconstructed images including MIPs were obtained and reviewed to evaluate the vascular anatomy.  Contrast: OMNIPAQUE IOHEXOL 350 MG/ML SOLN  Comparison: 02/18/2013  Findings: No significant filling defect or pulmonary embolus by the CTA.  Moderate pericardial effusion.  Reflux of contrast into the IVC and hepatic veins compatible with right heart strain versus elevated right heart pressures.  Chronic right paratracheal  nodal mass with calcification, suspect remote granulomatous disease.  No upper abdominal acute finding.  Lung windows demonstrate decreased lung volumes with scattered nonspecific ground-glass opacities and basilar atelectasis.  No pneumonia, airspace process, collapse or consolidation.  No acute osseous finding.  IMPRESSION: Negative for acute pulmonary embolus.  Moderate pericardial effusion with evidence of elevated right heart pressures with refluxing contrast into the IVC and hepatic veins.  Chronic right paratracheal calcified nodal mass suspect remote granulomatous disease.  Low lung volumes with atelectasis   Original Report Authenticated By: Judie Petit. Shick, M.D.    Scheduled Meds: . enoxaparin (LOVENOX) injection  40 mg Subcutaneous Q24H  . levothyroxine  100 mcg Oral QAC breakfast  . sodium chloride  3 mL Intravenous Q12H   Continuous Infusions:   Active Problems:   Pericardial effusion   Hypothyroidism (acquired)   Hypokalemia   Pancytopenia    Time spent: 35 minutes    [facility]  [facility] Pager [phone]. If 7PM-7AM, please contact night-coverage at [email or web address], password [facility] 02/20/2013, 11:28 AM  LOS: 2 days

## 2013-02-21 DIAGNOSIS — E039 Hypothyroidism, unspecified: Secondary | ICD-10-CM | POA: Diagnosis present

## 2013-02-21 LAB — BASIC METABOLIC PANEL
BUN: 20 mg/dL (ref 6–23)
CO2: 25 mEq/L (ref 19–32)
Chloride: 107 mEq/L (ref 96–112)
Creatinine, Ser: 1.1 mg/dL (ref 0.50–1.10)
Glucose, Bld: 88 mg/dL (ref 70–99)

## 2013-02-21 LAB — CBC
HCT: 31.9 % — ABNORMAL LOW (ref 36.0–46.0)
Hemoglobin: 10.3 g/dL — ABNORMAL LOW (ref 12.0–15.0)
MCH: 24.6 pg — ABNORMAL LOW (ref 26.0–34.0)
MCHC: 32.3 g/dL (ref 30.0–36.0)
MCV: 76.1 fL — ABNORMAL LOW (ref 78.0–100.0)
RDW: 16.4 % — ABNORMAL HIGH (ref 11.5–15.5)

## 2013-02-21 MED ORDER — LEVOTHYROXINE SODIUM 100 MCG PO TABS: 100.0000 ug | ORAL_TABLET | Freq: Every day | ORAL | Status: DC

## 2013-02-21 NOTE — Care Management Note (Signed)
    Page 1 of 1   02/21/2013     4:55:03 PM   CARE MANAGEMENT NOTE 02/21/2013  Patient:  Marissa [name], Marissa [name]   Account Number:  [id number]  Date Initiated:  02/21/2013  Documentation initiated by:  Jiles Crocker  Subjective/Objective Assessment:   ADMITTED WITH PERICARDIAL EFFUSION     Action/Plan:   PCP: Dorisann Frames, MD  LIVES AT HOME WITH SPOUSE;   Anticipated DC Date:  02/21/2013   Anticipated DC Plan:  HOME/SELF CARE      DC Planning Services  CM consult  Follow-up appt scheduled      Choice offered to / List presented to:             Status of service:  Completed, signed off Medicare Important Message given?  NA - LOS <3 / Initial given by admissions (If response is "NO", the following Medicare IM given date fields will be blank) Date Medicare IM given:   Date Additional Medicare IM given:    Discharge Disposition:  HOME/SELF CARE  Per UR Regulation:  Reviewed for med. necessity/level of care/duration of stay  If discussed at Long Length of Stay Meetings, dates discussed:    Comments:  5/19/2014Abelino Derrick RN,BSN,MHA  02/21/13 16:53 Letha Cape RN, BSN [phone] patient lives with spouse, does not speak English, pt for dc today, called for interpretor for [place].  Patient has transportation and medication coverage, RN went over dc information with patient also, given info about f/u appts.

## 2013-02-21 NOTE — Discharge Summary (Signed)
Physician Discharge Summary  Krissi Thi ZOX:[id number] DOB: 03/02/1966 DOA: 02/18/2013  PCP: Dorisann Frames, MD  Admit date: 02/18/2013 Discharge date: 02/21/2013  Time spent: 70 min. Used Airline pilot for 20+ min.  Recommendations for Outpatient Follow-up:  Follow up with endocrinology for follow up TSH.  Hospitalized with myxedema after thyroidectomy.  Follow up with [facility] to ensure resolution of pericardial effusion.  Discharge Diagnoses:  Active Problems:   Pericardial effusion   Hypothyroidism (acquired)   Hypokalemia   Pancytopenia   Myxedema   Discharge Condition: stable.  Still with some cough  Diet recommendation: heart healthy  Filed Weights   02/18/13 2100  Weight: 60 kg (132 lb 4.4 oz)    History of present illness:  This is a 47 y.o. female with history significant for thyroid cancer and status post thyroidectomy 09/2012 presents with above complaints.she is [place] and speaks "boo[place]nong" dialect. History is obtained through her son who is interpreting at the bedside. She states that she ran out of her Synthroid/medications 3 months ago and for the past 2 weeks she's had increasing fatigue and felt like her voice was 'different' and so she came to the ED so she could get her medication as prescribed.she also states that she has had some facial swelling. she denies constipation, temperature intolerance, and no hair loss she/coarse hair and no leg swelling. She denies chest pain, and when asked more about the shortness of breath noted earlier per EDP she states she just feels mostly tired.she was seen in the ED and a chest x-ray showed a 3 cm right paratracheal lymph node,CT of soft tissue neck showed no evidence of gross tumor or adenopathy.CT angio of the chest was done and was negative for PE moderate pericardial effusion with evidence of elevated right heart pressures with reflux of contrast into the IVC and hepatic veins was noted and cardiology was consulted per EDP  and admission to [facility] requested.  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  Symptomatic. -Started on 100 mcg of Synthroid. -TSH should be checked back again in 4-6 weeks as outpatient.  -Case management has been engaged to secure follow up.  Hoarseness of voice  -CT scan of the neck soft tissues showed no evidence of tumor recurrence.  -ENT consulted.  They performed flexibly laryngoscopy.  Which was normal.   Pericardial effusion  -Likely secondary to myxedema. 12-lead EKG. 2D echo results listed below. -From the CT scan seems to have early tamponade physiology.  -Cardiology evaluated the patient, recommended no procedural intervention.  -Cardiology recommended treatment of myxedema Synthroid and close followup.   Hypokalemia  -Replaced potassium   Pancytopenia  -mild, in patient with history of thyroid cancer a follow recheck , ESR is normal.  -Anemia panel is normal.  History of thyroid cancer  -s/p thyroidectomy    Discharge Exam: Filed Vitals:   02/20/13 0600 02/20/13 1438 02/20/13 2255 02/21/13 0605  BP: 92/63 98/69 113/66 104/70  Pulse: 68 66 62 52  Temp: 98.1 F (36.7 C) 98.4 F (36.9 C) 98 F (36.7 C) 97.6 F (36.4 C)  TempSrc: Oral Oral Oral Oral  Resp: 16 16 16 17   Height:      Weight:      SpO2: 94% 96% 96% 99%    General: Alert and awake, oriented x3, not in any acute distress.  Family present in the room. HEENT: anicteric sclera, pupils reactive to light and accommodation, EOMI  CVS: S1-S2 clear, no  murmur rubs or gallops  Chest: clear to auscultation bilaterally, no wheezing, rales or rhonchi  Abdomen: soft nontender, nondistended, normal bowel sounds, no organomegaly  Extremities: no cyanosis, clubbing or edema noted bilaterally  Neuro: Cranial nerves II-XII intact, no focal neurological deficits   Discharge Instructions      Discharge Orders   Future  Appointments Provider Department Dept Phone   03/18/2013 10:30 AM Thurmon Fair, MD [facility] [place] [phone]   Future Orders Complete By Expires     Diet - low sodium heart healthy  As directed     Increase activity slowly  As directed         Medication List    TAKE these medications       levothyroxine 100 MCG tablet  Commonly known as:  SYNTHROID, LEVOTHROID  Take 1 tablet (100 mcg total) by mouth daily before breakfast.     TYLENOL 325 MG tablet  Generic drug:  acetaminophen  Take 650 mg by mouth every 6 (six) hours.       No Known Allergies Follow-up Information   Follow up with Dorisann Frames, MD On .   Contact information:   [address] [place] [address] [phone]       Follow up with Thurmon Fair, MD On 03/18/2013. (10:30 to ensure pericardial effusion has resolved.)    Contact information:   [address] [place] [address] [phone]        The results of significant diagnostics from this hospitalization (including imaging, microbiology, ancillary and laboratory) are listed below for reference.    Significant Diagnostic Studies:  Flexible Laryngoscopy results  Procedure Note: 31575 Informed verbal consent was obtained after explaining the risks (including bleeding and infection), benefits and alternatives of the procedure. Verbal timeout was performed prior to the procedure. The nose was topicalized with topical oxymetazoline. The flexible 4mm scope was advanced through the left nasal cavity. The septum and turbinates appeared normal. The middle meatus was free of polyps or purulence. The eustachian tube, choana, and nasopharynx were normal in appearance. The hypopharynx, arytenoids, false vocal folds, and true vocal folds appeared normal with normal true vocal fold adduction and abduction and no masses, lesions, or edema. The visualized portion of the subglottis appeared  normal. The patient tolerated the procedure with no immediate complications.  A/P: normal neck CT, normal exam, and completely normal flexible laryngoscopy. Can follow up with Dr. Pollyann Kennedy as needed.   2D Echocardiogram Study Conclusions  - Left ventricle: The cavity size was normal. Systolic function was normal. The estimated ejection fraction was in the range of 50% to 55%. Wall motion was normal; there were no regional wall motion abnormalities. The study is not technically sufficient to allow evaluation of LV diastolic function. - Aortic valve: Mild regurgitation. - Pericardium, extracardiac: A moderate, free-flowing pericardial effusion was identified circumferential to the heart. The fluid had no internal echoes.There was no evidence of hemodynamic compromise.  Dg Chest 2 View  02/18/2013   *RADIOLOGY REPORT*  Clinical Data: Short of breath.  Thyroid cancer  CHEST - 2 VIEW  Comparison: 07/19/2012  Findings: 3 cm right paratracheal lymph node is similar to the prior study.  No other adenopathy.  No infiltrate or effusion. Cardiac enlargement without heart failure.  No peripheral lung nodule.  IMPRESSION: 3 cm right paratracheal lymph nodes, unchanged.  CT chest with contrast is suggested for further evaluation.  This could represent a neoplastic process including metastatic disease from thyroid  cancer.   Original Report Authenticated By: Janeece Riggers, M.D.   Ct Soft Tissue Neck W Contrast  02/18/2013   *RADIOLOGY REPORT*  Clinical Data: Pain in the neck and throat.  Difficulty breathing. Thyroidectomy in December 2013.  CT NECK WITH CONTRAST  Technique:  Multidetector CT imaging of the neck was performed with intravenous contrast.  Contrast: OMNIPAQUE IOHEXOL 350 MG/ML SOLN  Comparison: Nuclear medicine examinations earlier this year. Ultrasound 05/14/2012.  Findings: Limited visualization of the intracranial contents does not show any abnormality.  No parotid lesions.  Submandibular glands  are normal.  There has been previous thyroidectomy.  There is some scarring in the thyroid bed.  I cannot discern any specific tumor or mass.  There are no pathologically enlarged lymph nodes on either side of the neck.  No evidence of fluid collection.  There are normal sized level II nodes bilaterally.  Ordinary degenerative changes effect the spine.  No mucosal or submucosal lesion is seen. No lesions seen obstructing the airway or the esophagus.  See results of the chest examination for description of disease in the chest.  IMPRESSION: Previous thyroidectomy.  No evidence of gross tumor or adenopathy in the neck.  No obstructive lesion of the airway or esophagus. See results of chest CT for pathology in that region.   Original Report Authenticated By: Paulina Fusi, M.D.   Ct Angio Chest Pe W/cm &/or Wo Cm  02/18/2013   *RADIOLOGY REPORT*  Clinical Data: Shortness of breath, neck and throat pain  CT ANGIOGRAPHY CHEST  Technique:  Multidetector CT imaging of the chest using the standard protocol during bolus administration of intravenous contrast. Multiplanar reconstructed images including MIPs were obtained and reviewed to evaluate the vascular anatomy.  Contrast: OMNIPAQUE IOHEXOL 350 MG/ML SOLN  Comparison: 02/18/2013  Findings: No significant filling defect or pulmonary embolus by the CTA.  Moderate pericardial effusion.  Reflux of contrast into the IVC and hepatic veins compatible with right heart strain versus elevated right heart pressures.  Chronic right paratracheal nodal mass with calcification, suspect remote granulomatous disease.  No upper abdominal acute finding.  Lung windows demonstrate decreased lung volumes with scattered nonspecific ground-glass opacities and basilar atelectasis.  No pneumonia, airspace process, collapse or consolidation.  No acute osseous finding.  IMPRESSION: Negative for acute pulmonary embolus.  Moderate pericardial effusion with evidence of elevated right heart  pressures with refluxing contrast into the IVC and hepatic veins.  Chronic right paratracheal calcified nodal mass suspect remote granulomatous disease.  Low lung volumes with atelectasis   Original Report Authenticated By: Judie Petit. Shick, M.D.    Labs: Basic Metabolic Panel:  Recent Labs Lab 02/18/13 1047 02/18/13 1937 02/19/13 0645 02/20/13 0633 02/21/13 0430  NA 140  --  140 139 139  K 3.4*  --  3.9 3.8 3.8  CL 103  --  104 101 107  CO2 29  --  23 26 25   GLUCOSE 82  --  85 82 88  BUN 13  --  15 21 20   CREATININE 1.07 1.05 1.13* 1.32* 1.10  CALCIUM 8.8  --  9.8 9.1 8.6   Liver Function Tests:  Recent Labs Lab 02/18/13 1047  AST 71*  ALT 75*  ALKPHOS 82  BILITOT 0.8  PROT 8.0  ALBUMIN 4.6   CBC:  Recent Labs Lab 02/18/13 1047 02/18/13 1937 02/19/13 0645 02/20/13 0633 02/21/13 0430  WBC 2.4* 3.0* 3.4* 3.1* 2.7*  NEUTROABS 1.3*  --   --   --   --  HGB 10.9* 11.2* 11.3* 11.5* 10.3*  HCT 33.0* 33.9* 34.3* 35.3* 31.9*  MCV 75.3* 75.5* 75.1* 75.1* 76.1*  PLT 138* 118* 129* 139* 123*     Results for [facility], Lita (MRN [id number]) as of 02/21/2013 14:08  Ref. Range 02/20/2013 12:01  Iron Latest Range: 42-135 ug/dL 782  UIBC Latest Range: 125-400 ug/dL 956  TIBC Latest Range: 250-470 ug/dL 213  Saturation Ratios Latest Range: 20-55 % 40  Ferritin Latest Range: 10-291 ng/mL 133  Folate No range found 14.5  Vitamin B-12 Latest Range: 211-911 pg/mL 692      Signed:  Conley Canal  [facility] 02/21/2013, 2:20 PM

## 2013-02-21 NOTE — Progress Notes (Signed)
NURSING PROGRESS NOTE  Tericka Riley Nearing [id number] Discharge Data: 02/21/2013 11:19 AM Attending Provider: Clydia Llano, MD WGN:[id number], MD     Zafira Thi to be D/C'd Home per MD order.  Discussed with the patient the After Visit Summary and all questions fully answered. All IV's discontinued with no bleeding noted. All belongings returned to patient for patient to take home.   Last Vital Signs:  Blood pressure 104/70, pulse 52, temperature 97.6 F (36.4 C), temperature source Oral, resp. rate 17, height 5' (1.524 m), weight 60 kg (132 lb 4.4 oz), SpO2 99.00%.  Discharge Medication List   Medication List    ASK your doctor about these medications       TYLENOL 325 MG tablet  Generic drug:  acetaminophen  Take 650 mg by mouth every 6 (six) hours.

## 2013-02-21 NOTE — Progress Notes (Signed)
UR COMPLETED  

## 2013-02-21 NOTE — Discharge Summary (Signed)
Addendum  Patient seen and examined, chart and data base reviewed.  I agree with the above assessment and discharge plan.  For full details please see Mrs. Algis Downs PA note.  Severe hypothyroidism (myxedema) with pericardia effusion on myopathy (muscle aches and pains).  Discharge was facilitated by a translator.  Followup with endocrinology and cardiology as outpatient.   Clint Lipps, MD [facility] Pager: [phone] 02/21/2013, 2:27 PM

## 2013-03-18 ENCOUNTER — Ambulatory Visit (INDEPENDENT_AMBULATORY_CARE_PROVIDER_SITE_OTHER): Payer: BC Managed Care – PPO | Admitting: Cardiovascular Disease

## 2013-03-18 VITALS — BP 104/72 | HR 66 | Resp 12 | Ht 61.0 in | Wt 121.2 lb

## 2013-03-18 DIAGNOSIS — I319 Disease of pericardium, unspecified: Secondary | ICD-10-CM

## 2013-03-18 DIAGNOSIS — I313 Pericardial effusion (noninflammatory): Secondary | ICD-10-CM

## 2013-03-18 NOTE — Patient Instructions (Addendum)
Your physician has requested that you have an echocardiogram. Echocardiography is a painless test that uses sound waves to create images of your heart. It provides your doctor with information about the size and shape of your heart and how well your heart's chambers and valves are working. This procedure takes approximately one hour. There are no restrictions for this procedure. Please schedule in 1 month. We will call you with the results.    Your physician recommends that you follow-up as needed.

## 2013-03-19 ENCOUNTER — Encounter: Payer: Self-pay | Admitting: Cardiovascular Disease

## 2013-03-19 NOTE — Progress Notes (Signed)
Patient ID: Marissa [name], female   DOB: [birth year], 47 y.o.   MRN: [id number]      Reason for office visit Followup pericardial effusion  The patient underwent complete thyroid resection for thyroid cancer but then did not take hormone replacement therapy for months. She presented with numerous complaints many of which appear to be related to myxedema. Her radiological studies demonstrated a pericardial effusion, confirmed by echo, but without signs of temp and not her other hemodynamic consequences. Her TSH was extremely elevated. It was felt highly likely that her effusion was simply related to myxedema and that it would resolve with thyroid hormone supplementation.  After starting treatment with levothyroxine she feels much better. She has regained a lot of her energy. Her voice and swallowing and throat discomfort have all improved. Her facial myxedematous features are getting better quickly. She has not had any episodes of chest pain syncope shortness of breath or palpitations.    No Known Allergies  Current Outpatient Prescriptions  Medication Sig Dispense Refill  . acetaminophen (TYLENOL) 325 MG tablet Take 650 mg by mouth every 6 (six) hours.      Marland Kitchen levothyroxine (SYNTHROID, LEVOTHROID) 100 MCG tablet Take 1 tablet (100 mcg total) by mouth daily before breakfast.  30 tablet  6   No current facility-administered medications for this visit.    Past Medical History  Diagnosis Date  . Cancer     THYROID   CANCER    Past Surgical History  Procedure Laterality Date  . Thyroidectomy  07/23/2012    Procedure: THYROIDECTOMY;  Surgeon: Serena Colonel, MD;  Location: [facility] OR;  Service: ENT;  Laterality: Right;  Right thyroid lobectomy possible total   . Thyroidectomy  09/15/2012    Procedure: THYROIDECTOMY;  Surgeon: Serena Colonel, MD;  Location: [facility] OR;  Service: ENT;  Laterality: Left;  COMPLETION OF THYROIDECTOMY    No family history on file.  History   Social History  . Marital Status:  Married    Spouse Name: N/A    Number of Children: N/A  . Years of Education: N/A   Occupational History  . Not on file.   Social History Main Topics  . Smoking status: Never Smoker   . Smokeless tobacco: Never Used  . Alcohol Use: No  . Drug Use: No  . Sexually Active: Yes   Other Topics Concern  . Not on file   Social History Narrative  . No narrative on file    Review of systems: The review of systems is obtained exclusively via her daughter which serves as Equities trader. The patient continues to endorse some throat discomfort which is improving.  The patient specifically denies any chest pain at rest or with exertion, dyspnea at rest or with exertion, orthopnea, paroxysmal nocturnal dyspnea, syncope, palpitations, focal neurological deficits, intermittent claudication, lower extremity edema, unexplained weight gain, cough, hemoptysis or wheezing.  The patient also denies abdominal pain, nausea, vomiting, dysphagia, diarrhea, constipation, polyuria, polydipsia, dysuria, hematuria, frequency, urgency, abnormal bleeding or bruising, fever, chills, unexpected weight changes,auditory or visual problems, allergic reactions or rashes, new musculoskeletal complaints other than usual "aches and pains".   PHYSICAL EXAM BP 104/72  Pulse 66  Resp 12  Ht 5\' 1"  (1.549 m)  Wt 121 lb 3.2 oz (54.976 kg)  BMI 22.91 kg/m2  General: Alert, oriented x3, no distress Head: no evidence of trauma, PERRL, EOMI, no exophtalmos or lid lag, no myxedema, no xanthelasma; normal ears, nose and oropharynx; coarse skin and facial myxedema are  improving Neck: normal jugular venous pulsations and no hepatojugular reflux; brisk carotid pulses without delay and no carotid bruits. Thyroidectomy scar Chest: clear to auscultation, no signs of consolidation by percussion or palpation, normal fremitus, symmetrical and full respiratory excursions Cardiovascular: normal position and quality of the apical impulse,  regular rhythm, normal first and second heart sounds, no murmurs, rubs or gallops Abdomen: no tenderness or distention, no masses by palpation, no abnormal pulsatility or arterial bruits, normal bowel sounds, no hepatosplenomegaly Extremities: no clubbing, cyanosis or edema; 2+ radial, ulnar and brachial pulses bilaterally; 2+ right femoral, posterior tibial and dorsalis pedis pulses; 2+ left femoral, posterior tibial and dorsalis pedis pulses; no subclavian or femoral bruits Neurological: grossly nonfocal   EKG: Normal sinus rhythm, normal voltage  Lipid Panel  No results found for this basename: chol, trig, hdl, cholhdl, vldl, ldlcalc    BMET    Component Value Date/Time   NA 139 02/21/2013 0430   K 3.8 02/21/2013 0430   CL 107 02/21/2013 0430   CO2 25 02/21/2013 0430   GLUCOSE 88 02/21/2013 0430   BUN 20 02/21/2013 0430   CREATININE 1.10 02/21/2013 0430   CALCIUM 8.6 02/21/2013 0430   GFRNONAA 60* 02/21/2013 0430   GFRAA 69* 02/21/2013 0430     ASSESSMENT AND PLAN Pericardial effusion Her pericardial effusion was almost certainly related to myxedema, secondary to months of absent thyroid replacement therapy. Since she does have a history of thyroid malignancy. It is indicated to demonstrate complete resolution of the effusion after treatment for hypothyroidism. She will have a limited echocardiogram performed roughly one more month. Unless there is evidence of residual sizable pericardial effusion, further cardiology workup is not indicated.  Orders Placed This Encounter  Procedures  . EKG 12-Lead  . 2D Echocardiogram with contrast   No orders of the defined types were placed in this encounter.    Junious Silk, MD, [facility] [phone] office [phone] pager

## 2013-03-19 NOTE — Assessment & Plan Note (Signed)
Her pericardial effusion was almost certainly related to myxedema, secondary to months of absent thyroid replacement therapy. Since she does have a history of thyroid malignancy. It is indicated to demonstrate complete resolution of the effusion after treatment for hypothyroidism. She will have a limited echocardiogram performed roughly one more month. Unless there is evidence of residual sizable pericardial effusion, further cardiology workup is not indicated.

## 2013-04-15 ENCOUNTER — Ambulatory Visit (HOSPITAL_COMMUNITY)
Admission: RE | Admit: 2013-04-15 | Discharge: 2013-04-15 | Disposition: A | Discharge status: Home / Self Care | Payer: BC Managed Care – PPO | Source: Ambulatory Visit | Attending: Cardiovascular Disease | Admitting: Cardiovascular Disease

## 2013-04-15 DIAGNOSIS — I359 Nonrheumatic aortic valve disorder, unspecified: Secondary | ICD-10-CM | POA: Insufficient documentation

## 2013-04-15 DIAGNOSIS — I079 Rheumatic tricuspid valve disease, unspecified: Secondary | ICD-10-CM | POA: Insufficient documentation

## 2013-04-15 DIAGNOSIS — I319 Disease of pericardium, unspecified: Secondary | ICD-10-CM | POA: Insufficient documentation

## 2013-04-15 DIAGNOSIS — I313 Pericardial effusion (noninflammatory): Secondary | ICD-10-CM

## 2013-04-15 DIAGNOSIS — I059 Rheumatic mitral valve disease, unspecified: Secondary | ICD-10-CM | POA: Insufficient documentation

## 2013-04-15 NOTE — Progress Notes (Signed)
Marissa [name]   Limited 2D echo completed 04/15/2013.   Veda Canning, RDCS

## 2013-04-19 ENCOUNTER — Telehealth: Payer: Self-pay | Admitting: *Deleted

## 2013-04-19 NOTE — Telephone Encounter (Signed)
No answer at pts. Number.Will try to reach in AM.

## 2013-04-19 NOTE — Telephone Encounter (Signed)
Message copied by Vita Barley on Tue Apr 19, 2013  5:07 PM ------      Message from: Thurmon Fair      Created: Sun Apr 17, 2013  6:48 PM       Pericardial effusion is gone after treatment of hypothyroidism ------

## 2013-04-25 ENCOUNTER — Telehealth: Payer: Self-pay | Admitting: *Deleted

## 2013-04-25 NOTE — Telephone Encounter (Signed)
Message copied by Vita Barley on Mon Apr 25, 2013 12:06 PM ------      Message from: Thurmon Fair      Created: Sun Apr 17, 2013  6:48 PM       Pericardial effusion is gone after treatment of hypothyroidism ------

## 2013-04-26 ENCOUNTER — Encounter: Payer: Self-pay | Admitting: *Deleted

## 2013-04-26 NOTE — Telephone Encounter (Signed)
Returning your call. °

## 2013-04-27 NOTE — Telephone Encounter (Signed)
Returned call w/echo results. Pt voiced understanding

## 2013-10-14 ENCOUNTER — Other Ambulatory Visit (HOSPITAL_COMMUNITY): Payer: Self-pay | Admitting: Endocrinology

## 2013-10-14 DIAGNOSIS — C73 Malignant neoplasm of thyroid gland: Secondary | ICD-10-CM

## 2013-10-31 ENCOUNTER — Encounter (HOSPITAL_COMMUNITY)
Admission: RE | Admit: 2013-10-31 | Discharge: 2013-10-31 | Disposition: A | Discharge status: Home / Self Care | Payer: BC Managed Care – PPO | Source: Ambulatory Visit | Attending: Endocrinology | Admitting: Endocrinology

## 2013-10-31 DIAGNOSIS — C73 Malignant neoplasm of thyroid gland: Secondary | ICD-10-CM | POA: Insufficient documentation

## 2013-10-31 MED ORDER — THYROTROPIN ALFA 1.1 MG IM SOLR
0.9000 mg | INTRAMUSCULAR | Status: AC
  Administered 2013-10-31: 0.9 mg via INTRAMUSCULAR

## 2013-11-01 ENCOUNTER — Encounter (HOSPITAL_COMMUNITY)
Admission: RE | Admit: 2013-11-01 | Discharge: 2013-11-01 | Disposition: A | Discharge status: Home / Self Care | Payer: BC Managed Care – PPO | Source: Ambulatory Visit | Attending: Endocrinology | Admitting: Endocrinology

## 2013-11-01 MED ORDER — THYROTROPIN ALFA 1.1 MG IM SOLR
0.9000 mg | INTRAMUSCULAR | Status: AC
  Administered 2013-11-01: 0.9 mg via INTRAMUSCULAR

## 2013-11-02 ENCOUNTER — Encounter (HOSPITAL_COMMUNITY)
Admission: RE | Admit: 2013-11-02 | Discharge: 2013-11-02 | Disposition: A | Discharge status: Home / Self Care | Payer: BC Managed Care – PPO | Source: Ambulatory Visit | Attending: Endocrinology | Admitting: Endocrinology

## 2013-11-02 LAB — HCG, SERUM, QUALITATIVE: PREG SERUM: NEGATIVE

## 2013-11-02 MED ORDER — SODIUM IODIDE I 131 CAPSULE
4.0000 | Freq: Once | INTRAVENOUS | Status: AC | PRN
  Administered 2013-11-02: 4 via ORAL

## 2013-11-04 ENCOUNTER — Encounter (HOSPITAL_COMMUNITY)
Admission: RE | Admit: 2013-11-04 | Discharge: 2013-11-04 | Disposition: A | Discharge status: Home / Self Care | Payer: BC Managed Care – PPO | Source: Ambulatory Visit | Attending: Endocrinology | Admitting: Endocrinology

## 2014-12-09 ENCOUNTER — Emergency Department (HOSPITAL_COMMUNITY)
Admission: EM | Admit: 2014-12-09 | Discharge: 2014-12-09 | Disposition: A | Discharge status: Home / Self Care | Payer: BLUE CROSS/BLUE SHIELD | Source: Home / Self Care | Attending: Family Medicine | Admitting: Family Medicine

## 2014-12-09 ENCOUNTER — Encounter (HOSPITAL_COMMUNITY): Payer: Self-pay | Admitting: Emergency Medicine

## 2014-12-09 DIAGNOSIS — M79602 Pain in left arm: Secondary | ICD-10-CM

## 2014-12-09 DIAGNOSIS — M79605 Pain in left leg: Secondary | ICD-10-CM

## 2014-12-09 DIAGNOSIS — J0101 Acute recurrent maxillary sinusitis: Secondary | ICD-10-CM

## 2014-12-09 MED ORDER — FLUTICASONE PROPIONATE 50 MCG/ACT NA SUSP: 1.0000 | Freq: Two times a day (BID) | NASAL | Status: DC

## 2014-12-09 MED ORDER — CLINDAMYCIN HCL 150 MG PO CAPS: 150.0000 mg | ORAL_CAPSULE | Freq: Three times a day (TID) | ORAL | Status: DC

## 2014-12-09 NOTE — ED Provider Notes (Signed)
CSN: [id number]     Arrival date & time 12/09/14  7829 History   First MD Initiated Contact with Patient 12/09/14 1031     Chief Complaint  Patient presents with  . Facial Swelling  . Leg Pain    left leg   (Consider location/radiation/quality/duration/timing/severity/associated sxs/prior Treatment) Patient is a 49 y.o. female presenting with leg pain. The history is provided by the patient.  Leg Pain Location:  Leg Injury: no   Leg location:  L lower leg Pain details:    Quality:  Hervey Ard (stands on job a lot.)   Radiates to:  Does not radiate   Severity:  Mild   Onset quality:  Gradual   Duration:  1 month Chronicity:  New Dislocation: no   Prior injury to area:  No Relieved by:  None tried Associated symptoms: no decreased ROM, no numbness, no stiffness and no swelling     Past Medical History  Diagnosis Date  . Cancer     THYROID   CANCER   Past Surgical History  Procedure Laterality Date  . Thyroidectomy  07/23/2012    Procedure: THYROIDECTOMY;  Surgeon: Izora Gala, MD;  Location: [facility] OR;  Service: ENT;  Laterality: Right;  Right thyroid lobectomy possible total   . Thyroidectomy  09/15/2012    Procedure: THYROIDECTOMY;  Surgeon: Izora Gala, MD;  Location: [place];  Service: ENT;  Laterality: Left;  COMPLETION OF THYROIDECTOMY   History reviewed. No pertinent family history. History  Substance Use Topics  . Smoking status: Never Smoker   . Smokeless tobacco: Never Used  . Alcohol Use: No   OB History    No data available     Review of Systems  Constitutional: Negative.   HENT: Positive for congestion, facial swelling and rhinorrhea.   Musculoskeletal: Negative for stiffness.    Allergies  Review of patient's allergies indicates no known allergies.  Home Medications   Prior to Admission medications   Medication Sig Start Date End Date Taking? Authorizing Provider  acetaminophen (TYLENOL) 325 MG tablet Take 650 mg by mouth every 6 (six) hours.     Historical Provider, MD  clindamycin (CLEOCIN) 150 MG capsule Take 1 capsule (150 mg total) by mouth 3 (three) times daily. 12/09/14   Billy Fischer, MD  fluticasone (FLONASE) 50 MCG/ACT nasal spray Place 1 spray into both nostrils 2 (two) times daily. 12/09/14   Billy Fischer, MD  levothyroxine (SYNTHROID, LEVOTHROID) 100 MCG tablet Take 1 tablet (100 mcg total) by mouth daily before breakfast. 02/21/13   Bobby Rumpf York, PA-C   BP 131/86 mmHg  Pulse 66  Temp(Src) 98.2 F (36.8 C) (Oral)  Resp 18  SpO2 97% Physical Exam  Constitutional: She is oriented to person, place, and time. She appears well-developed and well-nourished.  HENT:  Mouth/Throat: Oropharynx is clear and moist.  Musculoskeletal: She exhibits tenderness.       Left lower leg: She exhibits tenderness. She exhibits no bony tenderness, no swelling and no edema.       Legs: Neurological: She is alert and oriented to person, place, and time.  Skin: Skin is warm and dry.  Nursing note and vitals reviewed.   ED Course  Procedures (including critical care time) Labs Review Labs Reviewed - No data to display  Imaging Review No results found.   MDM   1. Acute recurrent maxillary sinusitis   2. Leg pain, anterior, left        Billy Fischer, MD 12/09/14  1052 

## 2014-12-09 NOTE — Discharge Instructions (Signed)
Ice, advil and support hose for leg pain, use medicine for sinus swelling as prescribed

## 2014-12-09 NOTE — ED Notes (Signed)
Pt states that her face has been swelling intermittently for a month along with left lower leg calf pain.

## 2014-12-15 ENCOUNTER — Emergency Department (HOSPITAL_COMMUNITY)
Admission: EM | Admit: 2014-12-15 | Discharge: 2014-12-15 | Disposition: A | Discharge status: Home / Self Care | Payer: BLUE CROSS/BLUE SHIELD | Attending: Emergency Medicine | Admitting: Emergency Medicine

## 2014-12-15 ENCOUNTER — Encounter (HOSPITAL_COMMUNITY): Payer: Self-pay

## 2014-12-15 DIAGNOSIS — R5383 Other fatigue: Secondary | ICD-10-CM | POA: Diagnosis not present

## 2014-12-15 DIAGNOSIS — E079 Disorder of thyroid, unspecified: Secondary | ICD-10-CM | POA: Diagnosis not present

## 2014-12-15 DIAGNOSIS — Z85850 Personal history of malignant neoplasm of thyroid: Secondary | ICD-10-CM | POA: Diagnosis not present

## 2014-12-15 DIAGNOSIS — Z79899 Other long term (current) drug therapy: Secondary | ICD-10-CM | POA: Diagnosis not present

## 2014-12-15 DIAGNOSIS — Z7951 Long term (current) use of inhaled steroids: Secondary | ICD-10-CM | POA: Insufficient documentation

## 2014-12-15 DIAGNOSIS — Z9889 Other specified postprocedural states: Secondary | ICD-10-CM | POA: Diagnosis not present

## 2014-12-15 DIAGNOSIS — R220 Localized swelling, mass and lump, head: Secondary | ICD-10-CM | POA: Diagnosis present

## 2014-12-15 NOTE — Discharge Instructions (Signed)
Nh??c N?ng Tuy?n Gip (Hypothyroidism) Tuy?n gip l m?t tuy?n l?n n?m ? ph?n tr??c, pha d??i c?a c?. Tuy?n gip gip ki?m sot chuy?n ha. Chuy?n ha l cch c? th? x? l th?c ?n. Tuy?n gip ki?m sot chuy?n ha b?ng hc-mn tyroxin. Khi tuy?n ny km ho?t ??ng (nh??c n?ng tuy?n gip), n t?o ra qu t hc-mn.  NGUYN NHN Cc nguyn nhn ny bao g?m:  Thi?u m tuy?n gip ho?c m ny b? ph h?y.  B??u gip do thi?u it.  B??u gip do thu?c.  D? t?t b?m sinh (t? khi sinh ra).  V?n ?? v?i tuy?n yn. V?n ?? ny gy ra thi?u TSH (hc-mn kch thch tuy?n gip). Hc-mn ny ?i h?i tuy?n gip t?o nhi?u hc-mn h?n. TRI?U CH?NG  M?t l? (c?m th?y nh? b?n khng c n?ng l??ng)  Khng ch?u ???c l?nh  T?ng cn (m?c d ?n ?? ?n bnh th??ng)  Da kh  Tc th c?ng  Kinh nguy?t khng ??u (n?u n?ng c th? d?n ??n v sinh)  Suy ngh? ch?m V?n ?? v? tim c?ng c th? gy ra do khng ?? l??ng hc-mn tuy?n gip. Nh??c n?ng tuy?n gip trn tr? s? sinh l ch?ng ??n ??n v l m?t d?ng v cng hi?m. ?i?u quan tr?ng d?ng ny ph?i ???c ??y ?? v ngay l?p t?c, n?u khng n s? nhanh chng d?n ??n ch?m pht tri?n v? th? ch?t v tinh th?n. CH?N ?ON ?? ch?ng t? nh??c n?ng tuy?n gip, chuyn gia ch?m [place] s?c kh?e c?a b?n c th? th?c hi?n cc xt nghi?m mu v siu m. ?i khi, d?u hi?u b? ?n. Chuyn gia ch?m [place] s?c kh?e c th? c?n theo di tnh tr?ng b?nh b?ng nh?ng xt nghi?m mu tr??c ho?c sau ch?n ?on v ?i?u tr?. ?I?U TR? N?ng ?? hc-mn tuy?n gip th?p ???c lm t?ng ln b?ng cch s? d?ng hc-mn tuy?n gip t?ng h?p. ?y l ph??ng php ?i?u tr? an ton, hi?u qu?. Ph??ng php ny th??ng m?t kho?ng b?n tu?n ?? c ???c tc d?ng ??y ?? c?a thu?c. Sau khi b?n ? c tc d?ng ??y ?? c?a thu?c, th??ng s? m?t kho?ng b?n tu?n n?a ?? v?n ?? h?t h?n. Chuyn gia ch?m [place] s?c kh?e c th? b?t ??u cho b?n s? d?ng li?u th?p. N?u b?n ? c v?n ?? v? tim, li?u l??ng c th? ???c t?ng ln t? t?. ?i?u ? th??ng khng ph?i l  tr??ng h?p kh?n c?p c?n ph?i nhanh chng tr? l?i bnh th??ng. H??NG D?N CH?M [place] T?I NH  S? d?ng thu?c theo nh? chuyn gia ch?m [place] s?c kh?e ?? ngh?Marland Kitchen Hy cho chuyn gia ch?m [place] s?c kh?e c?a b?n bi?t v? b?t k? lo?i thu?c no b?n ?ang s? d?ng ho?c b?t ??u s? d?ng. Chuyn gia ch?m [place] s?c kh?e s? gip b?n ln l?ch dng thu?c.  Khi tnh tr?ng c?a b?n c?i thi?n, nhu c?u v? li?u l??ng c?a b?n c th? t?ng. B?n s? c?n ti?p t?c lm cc xt nghi?m mu theo ?? ngh? c?a chuyn gia ch?m [place] s?c kh?e.  Hy bo co m?i nghi ng? v? tc d?ng ph? c?a thu?c cho chuyn gia ch?m  s?c kh?e c?a b?n. HY ?I KHM N?U: Hy ?i khm n?u b?n b?:  ?? m? hi.  Run (rng mnh).  Lo u.  S?t cn nhanh chng.  Khng ch?u ???c nng.  Thay ??i c?m xc.  Tiu ch?y.  Y?u. HY ?I KHM NGAY L?P T?C N?U: B?n b? ?au  ng?c, nh?p tim khng ??u (h?i h?p ?nh tr?ng ng?c), ho?c tim ??p nhanh. ??M B?O B?N:  Hi?u cc h??ng d?n ny.  S? theo di tnh tr?ng c?a mnh.  S? yu c?u tr? gip ngay l?p t?c n?u b?n c?m th?y khng ?? ho?c tnh tr?ng tr?m tr?ng h?n. Document Released: 09/22/2005 Document Revised: 05/25/2013 [facility] Patient Information 2015 [place]. This information is not intended to replace advice given to you by your health care provider. Make sure you discuss any questions you have with your health care provider.

## 2014-12-15 NOTE — ED Provider Notes (Signed)
CSN: [id number]     Arrival date & time 12/15/14  1441 History  This chart was scribed for non-physician practitioner working with Leonard Schwartz, MD by Judithann Sauger, ED Scribe. The patient was seen in room TR06C/TR06C and the patient's care was started at 5:34 PM    Chief Complaint  Patient presents with  . Facial Swelling   The history is provided by the patient and a relative. No language interpreter was used.   HPI Comments: Marissa [name] is a 49 y.o. female with a hx of thyroid cancer who presents to the Emergency Department complaining of constant facial swelling onset 1 week ago. She reports associated fatigue, and feeling sleepy and fatigued. She denies coughing and SOB. Her family member reports that she was placed on antibiotics at the Urgent Care for a sinus infection recently. She denies trouble swallowing. She denies any new soaps, lotions, or detergents. She denies that she is currently on the thyroid hormones.   PCP: Dr. Chalmers Cater, she has not seen him since Jan 2015.   Past Medical History  Diagnosis Date  . Cancer     THYROID   CANCER   Past Surgical History  Procedure Laterality Date  . Thyroidectomy  07/23/2012    Procedure: THYROIDECTOMY;  Surgeon: Izora Gala, MD;  Location: [facility] OR;  Service: ENT;  Laterality: Right;  Right thyroid lobectomy possible total   . Thyroidectomy  09/15/2012    Procedure: THYROIDECTOMY;  Surgeon: Izora Gala, MD;  Location: [place];  Service: ENT;  Laterality: Left;  COMPLETION OF THYROIDECTOMY   History reviewed. No pertinent family history. History  Substance Use Topics  . Smoking status: Never Smoker   . Smokeless tobacco: Never Used  . Alcohol Use: No   OB History    No data available     Review of Systems  Constitutional: Positive for fatigue. Negative for fever.  HENT: Positive for facial swelling. Negative for congestion, mouth sores, rhinorrhea, sinus pressure, sore throat and trouble swallowing.   Respiratory: Negative for  cough and shortness of breath.   Neurological: Negative for dizziness, light-headedness and headaches.      Allergies  Review of patient's allergies indicates no known allergies.  Home Medications   Prior to Admission medications   Medication Sig Start Date End Date Taking? Authorizing Provider  acetaminophen (TYLENOL) 325 MG tablet Take 650 mg by mouth every 6 (six) hours.    Historical Provider, MD  clindamycin (CLEOCIN) 150 MG capsule Take 1 capsule (150 mg total) by mouth 3 (three) times daily. 12/09/14   Billy Fischer, MD  fluticasone (FLONASE) 50 MCG/ACT nasal spray Place 1 spray into both nostrils 2 (two) times daily. 12/09/14   Billy Fischer, MD  levothyroxine (SYNTHROID, LEVOTHROID) 100 MCG tablet Take 1 tablet (100 mcg total) by mouth daily before breakfast. 02/21/13   Bobby Rumpf York, PA-C   BP 114/79 mmHg  Pulse 66  Resp 16  SpO2 98% Physical Exam  Constitutional: She is oriented to person, place, and time. She appears well-developed and well-nourished. No distress.  HENT:  Head: Normocephalic and atraumatic.  Right Ear: External ear normal.  Left Ear: External ear normal.  Nose: Nose normal.  Mouth/Throat: Oropharynx is clear and moist. No oropharyngeal exudate or posterior oropharyngeal edema.  No facial swelling noted.   Eyes: Conjunctivae and EOM are normal.  Neck: Neck supple. No tracheal deviation present.  Cardiovascular: Normal rate, regular rhythm, normal heart sounds and intact distal pulses.  Exam reveals no  gallop and no friction rub.   No murmur heard. Pulmonary/Chest: Effort normal and breath sounds normal. No respiratory distress. She has no wheezes. She has no rales.  Abdominal: Soft. There is no tenderness.  Musculoskeletal: Normal range of motion.  Neurological: She is alert and oriented to person, place, and time.  Skin: Skin is warm and dry. No rash noted. No erythema. No pallor.  Psychiatric: She has a normal mood and affect. Her behavior is normal.   Nursing note and vitals reviewed.   ED Course  Procedures (including critical care time) DIAGNOSTIC STUDIES: Oxygen Saturation is 97% on RAa, adequate by my interpretation.    COORDINATION OF CARE: 5:42 PM- Pt advised of plan for treatment and pt agrees.    Labs Review Labs Reviewed - No data to display  Imaging Review No results found.   EKG Interpretation None      Filed Vitals:   12/15/14 1754  BP: 114/79  Pulse: 66  Resp: 16  SpO2: 98%     MDM   Meds given in ED:  Medications - No data to display  Discharge Medication List as of 12/15/2014  5:46 PM      Final diagnoses:  Thyroid dysfunction  Other fatigue   This is a 49 year old female with a history of hypothyroidism who presents to the ED complaining of facial puffiness, fatigue and feeling tired. She reports not taking her synthroid in many months and has not had her thyroid checked in months. Last time her TSH was checked it was very high at 107 about one year ago. I do not feel comfortable prescribing synthroid at this time when she has not had a recent TSH and has not been taking it for many months. She reports she is able to get in with her PCP soon and is an established patient. I encouraged this as she needs blood work before starting any medications again. She is afebrile and non toxic appearing. There is no facial swelling or oropharyngeal edema or erythema. Patient denies any shortness of breath or difficulty breathing or swallowing. I advised the patient to follow-up with their primary care provider this week. I advised the patient to return to the emergency department with new or worsening symptoms or new concerns. The patient verbalized understanding and agreement with plan.   I personally performed the services described in this documentation, which was scribed in my presence. The recorded information has been reviewed and is accurate.      [name], PA-C 12/15/14 1841  Leonard Schwartz,  MD 12/16/14 1100

## 2014-12-15 NOTE — ED Notes (Signed)
Pt reporting facial swelling that has been present for about a month.  Pt seen and evaluated for same at urgent care the other day and told she had a sinus infection; pt reports  Medicine not working.

## 2014-12-23 IMAGING — CT CT NECK W/ CM
3 of 4 series · 16 of 33 positions shown, 19 images · IV contrast (CONTRAST)
Comparison: Nuclear medicine examinations earlier this year.
Ultrasound 05/14/2012.

CLINICAL DATA: Pain in the neck and throat.  Difficulty breathing.
Thyroidectomy in September 2012.

CT NECK WITH CONTRAST
TECHNIQUE: Multidetector CT imaging of the neck was performed with
intravenous contrast.
Contrast: 100mL OMNIPAQUE IOHEXOL 350 MG/ML SOLN

[Series 5: soft tissue · axial · 0.47mm/px · z∈[+383,+553]mm · 8 of 111 slices shown, 10 images]
[im 13/111  soft-tissue]
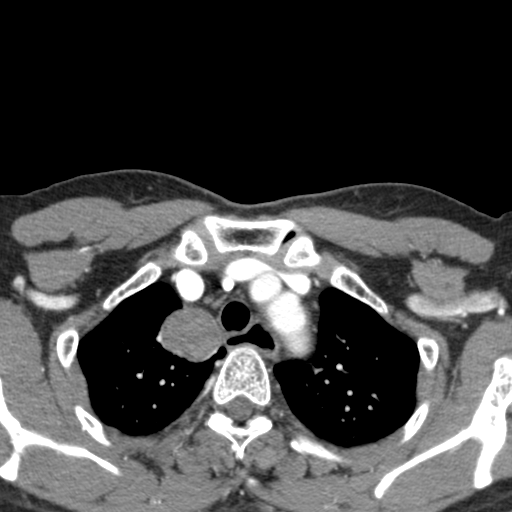
[im 13/111  bone]
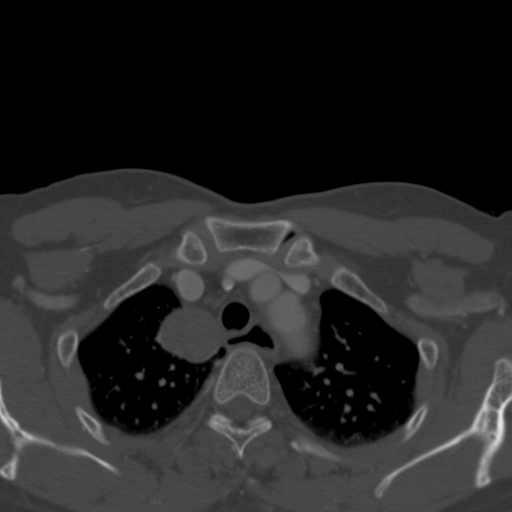
[im 25/111  bone]
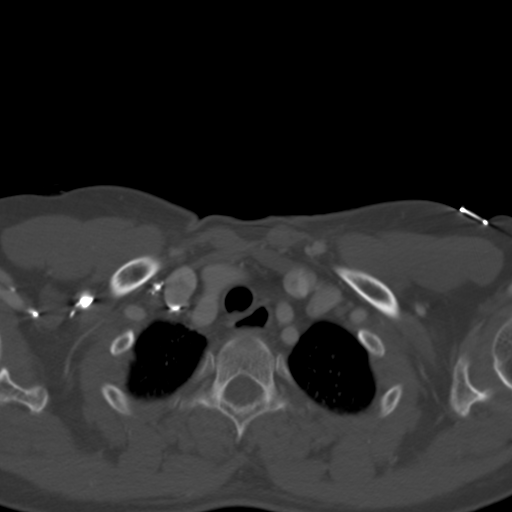
[im 37/111  bone]
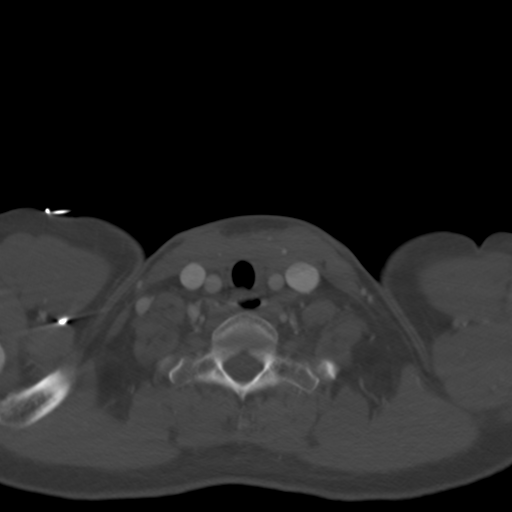
[im 49/111  bone]
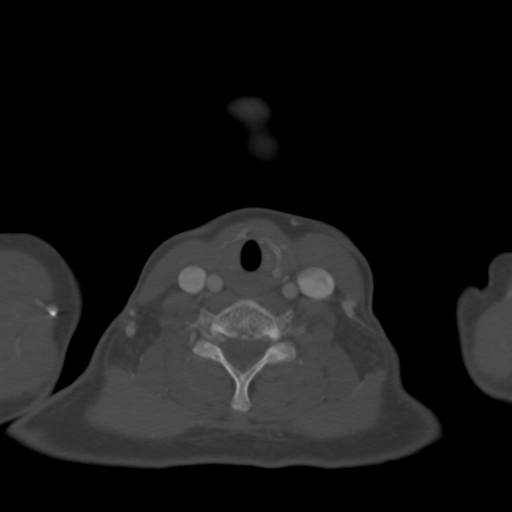
[im 62/111  soft-tissue]
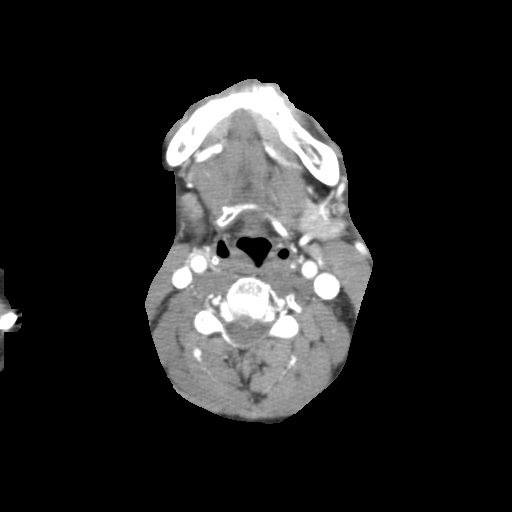
[im 62/111  bone]
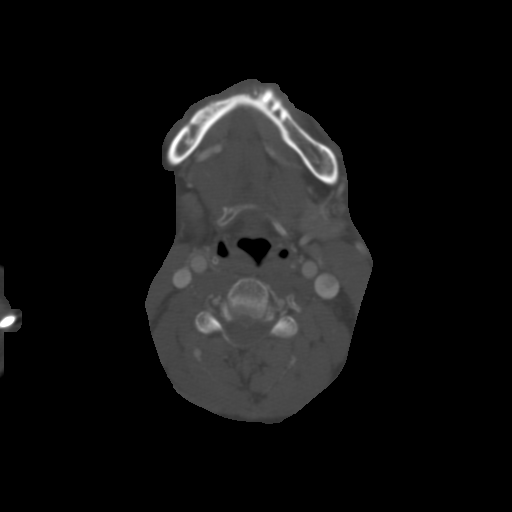
[im 74/111  bone]
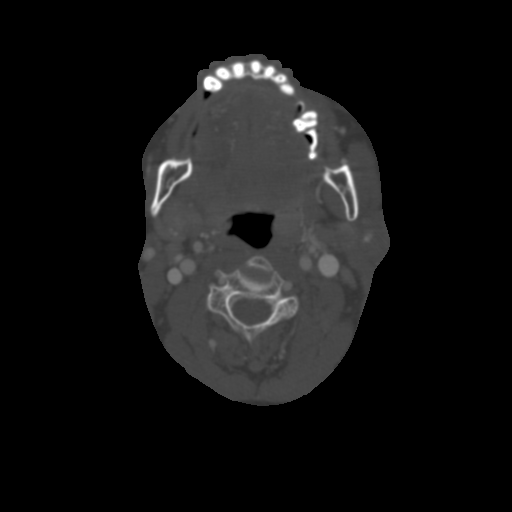
[im 86/111  bone]
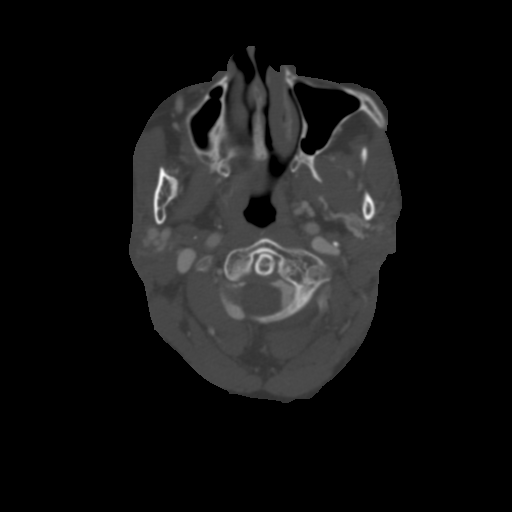
[im 98/111  bone]
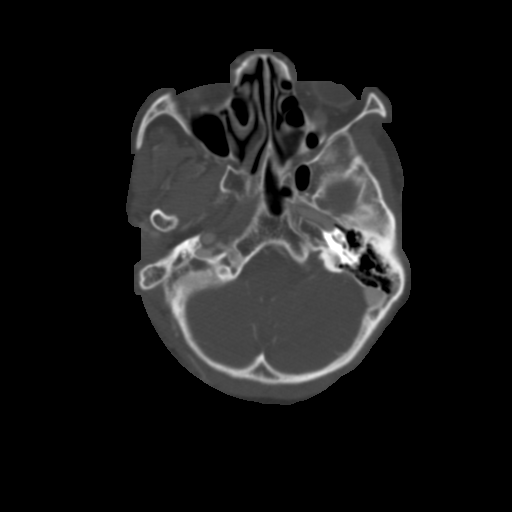

[mpr, sagittal, sagittal · sagittal · 0.47mm/px · 5 of 76 slices shown, 6 images]
[im 26/76  bone]
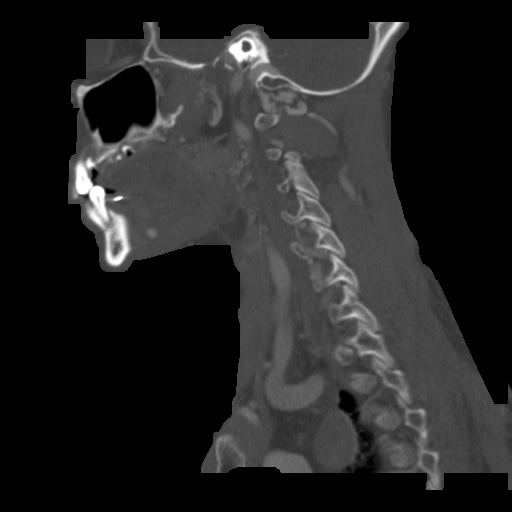
[im 32/76  bone]
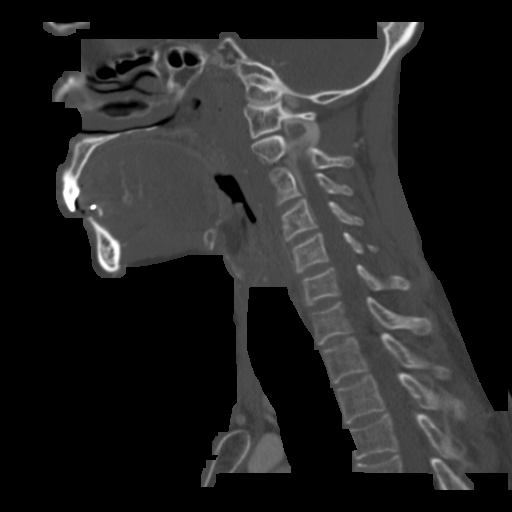
[im 38/76  soft-tissue]
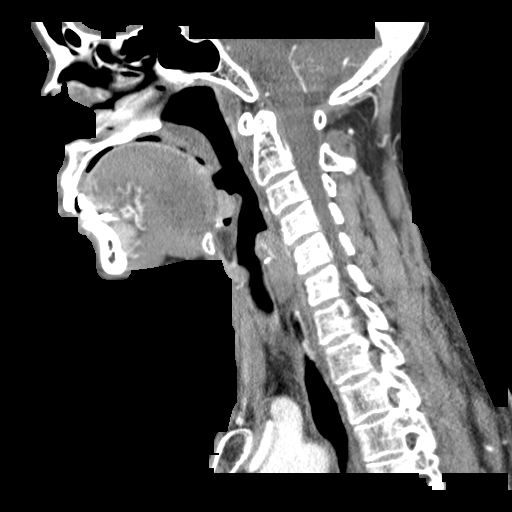
[im 38/76  bone]
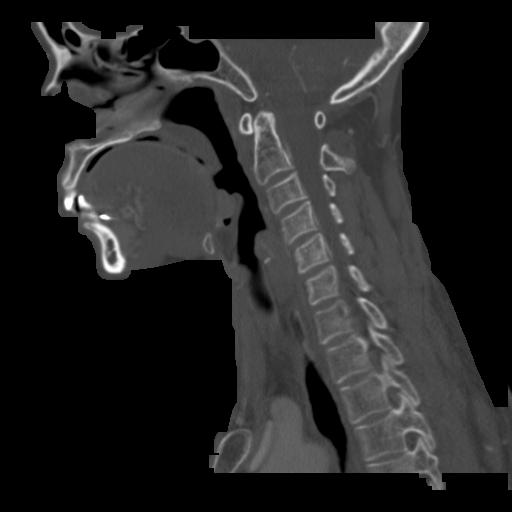
[im 44/76  bone]
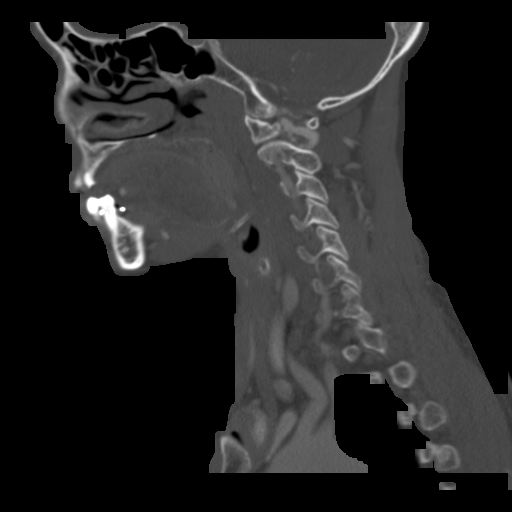
[im 51/76  bone]
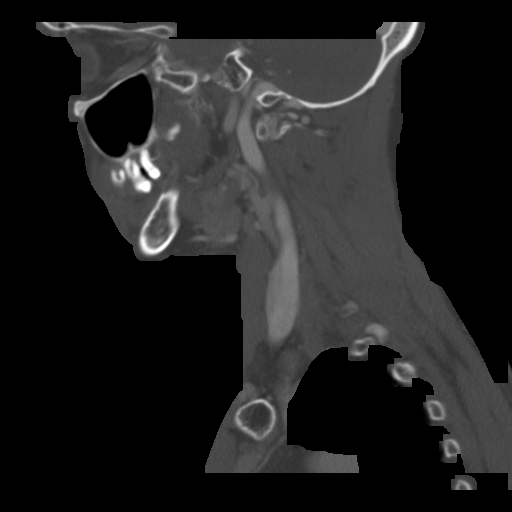

[mpr, coronal, coronal · coronal · 0.47mm/px · 3 of 106 slices shown]
[im 22/106  bone]
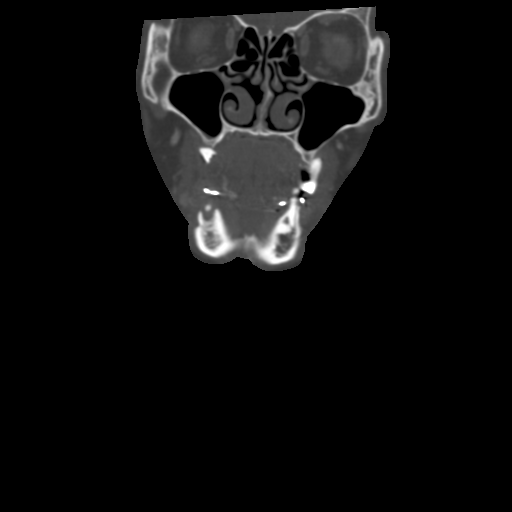
[im 43/106  bone]
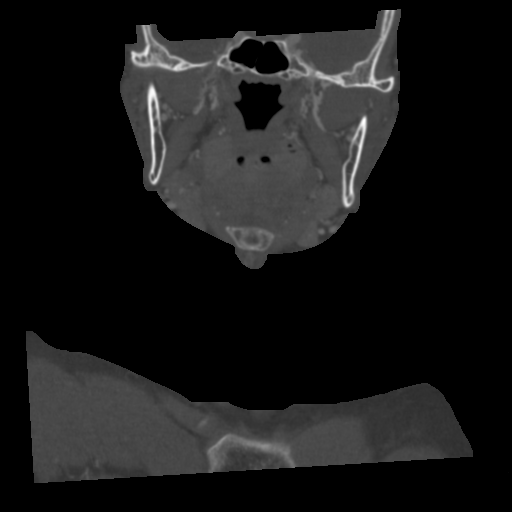
[im 64/106  bone]
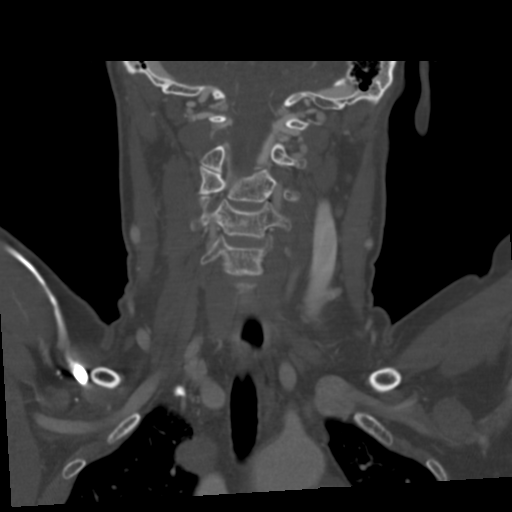

[16 of 33 positions shown; findings below may reference images not displayed]

FINDINGS: Limited visualization of the intracranial contents does
not show any abnormality.  No parotid lesions.  Submandibular
glands are normal.  There has been previous thyroidectomy.  There
is some scarring in the thyroid bed.  I cannot discern any specific
tumor or mass.  There are no pathologically enlarged lymph nodes on
either side of the neck.  No evidence of fluid collection.  There
are normal sized level II nodes bilaterally.  Ordinary degenerative
changes effect the spine.  No mucosal or submucosal lesion is seen.
No lesions seen obstructing the airway or the esophagus.  See
results of the chest examination for description of disease in the
chest.
IMPRESSION: Previous thyroidectomy.  No evidence of gross tumor or adenopathy
in the neck.  No obstructive lesion of the airway or esophagus.
See results of chest CT for pathology in that region.

## 2016-12-24 ENCOUNTER — Ambulatory Visit (HOSPITAL_COMMUNITY)
Admission: EM | Admit: 2016-12-24 | Discharge: 2016-12-24 | Disposition: A | Discharge status: Home / Self Care | Payer: Self-pay | Attending: Emergency Medicine | Admitting: Emergency Medicine

## 2016-12-24 ENCOUNTER — Encounter (HOSPITAL_COMMUNITY): Payer: Self-pay | Admitting: Emergency Medicine

## 2016-12-24 DIAGNOSIS — B029 Zoster without complications: Secondary | ICD-10-CM

## 2016-12-24 MED ORDER — LIDOCAINE 5 % EX PTCH: 1.0000 | MEDICATED_PATCH | CUTANEOUS | 0 refills | Status: DC

## 2016-12-24 MED ORDER — IBUPROFEN 600 MG PO TABS: 600.0000 mg | ORAL_TABLET | Freq: Four times a day (QID) | ORAL | 0 refills | Status: DC | PRN

## 2016-12-24 MED ORDER — TRAMADOL HCL 50 MG PO TABS: 50.0000 mg | ORAL_TABLET | Freq: Four times a day (QID) | ORAL | 0 refills | Status: DC | PRN

## 2016-12-24 MED ORDER — GABAPENTIN 300 MG PO CAPS: ORAL_CAPSULE | ORAL | 0 refills | Status: DC

## 2016-12-24 NOTE — ED Triage Notes (Signed)
The patient presented to the [facility] with a complaint of a rash on her trunk area x 2 weeks.

## 2016-12-24 NOTE — Discharge Instructions (Signed)
600 mg of ibuprofen with 1 g of Tylenol 3-4 times a day as needed for pain. This isn't very effective combination for pain. Tramadol for severe pain only.

## 2016-12-24 NOTE — ED Provider Notes (Signed)
HPI  SUBJECTIVE:  Marissa [name] is a 51 y.o. female who presents with 2 weeks of a constant, painful rash on her right trunk. She states that she had paresthesias preceding the rash. She states that the pain feels like "needles sticking her". States that the pain started in her back and started moving anteriorly. She tried [employer] and a traditional [place] medicine without improvement in her symptoms. Symptoms are worse with palpation. She denies nausea, vomiting, fevers, rash elsewhere. No new lotions, soaps, detergents, fevers, change in her medicine preceding the rash, bodyaches or flulike symptoms. She states that she is not under increased stress recently. She has a past medical history of thyroid cancer, myxedema, hypothyroidism and pericardial effusion. No history of chickenpox, shingles, kidney disease or GI bleed. OXB:DZHGD,JMEQASTM, MD   Past Medical History:  Diagnosis Date  . Cancer [facility])    THYROID   CANCER    Past Surgical History:  Procedure Laterality Date  . THYROIDECTOMY  07/23/2012   Procedure: THYROIDECTOMY;  Surgeon: Izora Gala, MD;  Location: [facility] OR;  Service: ENT;  Laterality: Right;  Right thyroid lobectomy possible total   . THYROIDECTOMY  09/15/2012   Procedure: THYROIDECTOMY;  Surgeon: Izora Gala, MD;  Location: [place];  Service: ENT;  Laterality: Left;  COMPLETION OF THYROIDECTOMY    History reviewed. No pertinent family history.  Social History  Substance Use Topics  . Smoking status: Never Smoker  . Smokeless tobacco: Never Used  . Alcohol use No    No current facility-administered medications for this encounter.   Current Outpatient Prescriptions:  .  levothyroxine (SYNTHROID, LEVOTHROID) 100 MCG tablet, Take 1 tablet (100 mcg total) by mouth daily before breakfast., Disp: 30 tablet, Rfl: 6 .  gabapentin (NEURONTIN) 300 MG capsule, 1 tab po at bedtime 1st day, 1 tablet bid second day, then 1 tablet tid, Disp: 20 capsule, Rfl: 0 .  ibuprofen  (ADVIL,MOTRIN) 600 MG tablet, Take 1 tablet (600 mg total) by mouth every 6 (six) hours as needed., Disp: 30 tablet, Rfl: 0 .  lidocaine (LIDODERM) 5 %, Place 1 patch onto the skin daily. Remove & Discard patch within 12 hours or as directed by MD, Disp: 30 patch, Rfl: 0 .  traMADol (ULTRAM) 50 MG tablet, Take 1 tablet (50 mg total) by mouth every 6 (six) hours as needed., Disp: 20 tablet, Rfl: 0  No Known Allergies   ROS  As noted in HPI.   Physical Exam  BP 107/63 (BP Location: Left Arm)   Pulse 70   Temp 98.4 F (36.9 C) (Oral)   Resp 16   SpO2 100%   Constitutional: Well developed, well nourished, no acute distress Eyes:  EOMI, conjunctiva normal bilaterally HENT: Normocephalic, atraumatic,mucus membranes moist Respiratory: Normal inspiratory effort Cardiovascular: Normal rate GI: nondistended skin: Healing tender scattered grouped rash in dermatomal distribution along the right torso. No crusting, erythema. It does not cross the midline. See picture.   Musculoskeletal: no deformities Neurologic: Alert & oriented x 3, no focal neuro deficits Psychiatric: Speech and behavior appropriate   ED Course   Medications - No data to display  No orders of the defined types were placed in this encounter.   No results found for this or any previous visit (from the past 24 hour(s)). No results found.  ED Clinical Impression  Herpes zoster without complication   ED Assessment/Plan  Presentation most consistent with shingles. She is beyond on the window where antivirals would be useful. The does  not appear to be any signs of infection. Home with Tylenol/ibuprofen, tramadol, lidocaine patches and Neurontin. She'll follow-up with her primary care physician if these do not help.  Discussed  MDM, plan and followup with patient and family. Patient and family agree with plan.   Meds ordered this encounter  Medications  . gabapentin (NEURONTIN) 300 MG capsule    Sig: 1 tab po  at bedtime 1st day, 1 tablet bid second day, then 1 tablet tid    Dispense:  20 capsule    Refill:  0  . ibuprofen (ADVIL,MOTRIN) 600 MG tablet    Sig: Take 1 tablet (600 mg total) by mouth every 6 (six) hours as needed.    Dispense:  30 tablet    Refill:  0  . traMADol (ULTRAM) 50 MG tablet    Sig: Take 1 tablet (50 mg total) by mouth every 6 (six) hours as needed.    Dispense:  20 tablet    Refill:  0  . lidocaine (LIDODERM) 5 %    Sig: Place 1 patch onto the skin daily. Remove & Discard patch within 12 hours or as directed by MD    Dispense:  30 patch    Refill:  0    *This clinic note was created using [employer]. Therefore, there may be occasional mistakes despite careful proofreading.  ?   Melynda Ripple, MD 12/24/16 1244

## 2019-01-14 ENCOUNTER — Ambulatory Visit (HOSPITAL_COMMUNITY)
Admission: EM | Admit: 2019-01-14 | Discharge: 2019-01-14 | Disposition: A | Discharge status: Home / Self Care | Payer: Medicaid Other | Attending: Family Medicine | Admitting: Family Medicine

## 2019-01-14 ENCOUNTER — Other Ambulatory Visit: Payer: Self-pay

## 2019-01-14 ENCOUNTER — Encounter (HOSPITAL_COMMUNITY): Payer: Self-pay

## 2019-01-14 DIAGNOSIS — G44209 Tension-type headache, unspecified, not intractable: Secondary | ICD-10-CM

## 2019-01-14 MED ORDER — KETOROLAC TROMETHAMINE 60 MG/2ML IM SOLN
INTRAMUSCULAR | Status: AC
  Filled 2019-01-14: qty 2

## 2019-01-14 MED ORDER — KETOROLAC TROMETHAMINE 60 MG/2ML IM SOLN
60.0000 mg | Freq: Once | INTRAMUSCULAR | Status: AC
  Administered 2019-01-14: 11:00:00 60 mg via INTRAMUSCULAR

## 2019-01-14 MED ORDER — NAPROXEN 375 MG PO TABS: 375.0000 mg | ORAL_TABLET | Freq: Two times a day (BID) | ORAL | 0 refills | Status: DC

## 2019-01-14 NOTE — ED Triage Notes (Signed)
Headache since Wednsday

## 2019-01-14 NOTE — Discharge Instructions (Addendum)
Toradol shot given in office Rest and drink plenty of fluids  Naproxen prescribed.  Take as needed for headache.  Avoid taking with other antiinflammatories as this may cause GI upset or bleed.   Follow up with PCP if symptoms persists Return or go to the ER if you have any new or worsening symptoms fever, chills, nausea, vomiting, slurred speech, chest pain, shortness of breath, abdominal pain, weakness, numbness or tingling, etc..Marland Kitchen

## 2019-01-14 NOTE — ED Provider Notes (Signed)
[place]   [id number] 01/14/19 Arrival Time: 0941  [id number]  SUBJECTIVE: Patient requests daughter to be used for interpreter  Marissa [name] is a 53 y.o. female who complains of intermittent headache x 2 days.  Denies a precipitating event, or recent head trauma.  Works at [employer] and does admit to heavy lifting and repetitive activities.  Patient localizes her pain to the right and left side of head.  Describes the pain as intermittent and pinching in character.  Patient has tried OTC tylenol without relief. Denies aggravating factors.  Reports similar symptoms in the past that improved with tylenol.  This is not the worst headache of their life.  Patient denies fever, chills, nausea, vomiting, aura, rhinorrhea, slurred speech, chest pain, SOB, abdominal pain, weakness, numbness or tingling.    ROS: As per HPI.  Past Medical History:  Diagnosis Date  . Cancer [facility])    THYROID   CANCER   Past Surgical History:  Procedure Laterality Date  . THYROIDECTOMY  07/23/2012   Procedure: THYROIDECTOMY;  Surgeon: Izora Gala, MD;  Location: [place];  Service: ENT;  Laterality: Right;  Right thyroid lobectomy possible total   . THYROIDECTOMY  09/15/2012   Procedure: THYROIDECTOMY;  Surgeon: Izora Gala, MD;  Location: [place];  Service: ENT;  Laterality: Left;  COMPLETION OF THYROIDECTOMY   No Known Allergies No current facility-administered medications on file prior to encounter.    Current Outpatient Medications on File Prior to Encounter  Medication Sig Dispense Refill  . ibuprofen (ADVIL,MOTRIN) 600 MG tablet Take 1 tablet (600 mg total) by mouth every 6 (six) hours as needed. 30 tablet 0  . levothyroxine (SYNTHROID, LEVOTHROID) 100 MCG tablet Take 1 tablet (100 mcg total) by mouth daily before breakfast. 30 tablet 6  . gabapentin (NEURONTIN) 300 MG capsule 1 tab po at bedtime 1st day, 1 tablet bid second day, then 1 tablet tid 20 capsule 0  . lidocaine (LIDODERM) 5 % Place 1  patch onto the skin daily. Remove & Discard patch within 12 hours or as directed by MD 30 patch 0  . traMADol (ULTRAM) 50 MG tablet Take 1 tablet (50 mg total) by mouth every 6 (six) hours as needed. 20 tablet 0   Social History   Socioeconomic History  . Marital status: Married    Spouse name: Not on file  . Number of children: Not on file  . Years of education: Not on file  . Highest education level: Not on file  Occupational History  . Not on file  Social Needs  . Financial resource strain: Not on file  . Food insecurity:    Worry: Not on file    Inability: Not on file  . Transportation needs:    Medical: Not on file    Non-medical: Not on file  Tobacco Use  . Smoking status: Never Smoker  . Smokeless tobacco: Never Used  Substance and Sexual Activity  . Alcohol use: No  . Drug use: No  . Sexual activity: Yes  Lifestyle  . Physical activity:    Days per week: Not on file    Minutes per session: Not on file  . Stress: Not on file  Relationships  . Social connections:    Talks on phone: Not on file    Gets together: Not on file    Attends religious service: Not on file    Active member of club or organization: Not on file    Attends meetings of clubs  or organizations: Not on file    Relationship status: Not on file  . Intimate partner violence:    Fear of current or ex partner: Not on file    Emotionally abused: Not on file    Physically abused: Not on file    Forced sexual activity: Not on file  Other Topics Concern  . Not on file  Social History Narrative  . Not on file   Family History  Family history unknown: Yes    OBJECTIVE:  Vitals:   01/14/19 1002  BP: 113/73  Pulse: 64  Resp: 16  Temp: (!) 97 F (36.1 C)    General appearance: alert; no distress Eyes: PERRLA; EOMI HENT: normocephalic; atraumatic; mildly TTP over right and left parietal bones; no obvious rash or swelling overlying scalp; nares patent; oropharynx clear Neck: supple with  FROM; no midline tenderness Lungs: clear to auscultation bilaterally Heart: regular rate and rhythm.  Radial pulses 2+ symmetrical bilaterally Extremities: no edema; symmetrical with no gross deformities Skin: warm and dry Neurologic: CN 2-12 grossly intact; finger to nose without difficulty; strength and sensation intact bilaterally about the upper and lower extremities; negative pronator drift Psychological: alert and cooperative; normal mood and affect  ASSESSMENT & PLAN:  1. Tension headache     Meds ordered this encounter  Medications  . ketorolac (TORADOL) injection 60 mg  . naproxen (NAPROSYN) 375 MG tablet    Sig: Take 1 tablet (375 mg total) by mouth 2 (two) times daily.    Dispense:  20 tablet    Refill:  0    Order Specific Question:   Supervising Provider    Answer:   Raylene Everts [[id number]]    Toradol shot given in office Rest and drink plenty of fluids  Naproxen prescribed.  Take as needed for headache.  Avoid taking with other antiinflammatories as this may cause GI upset or bleed.   Follow up with PCP if symptoms persists Return or go to the ER if you have any new or worsening symptoms fever, chills, nausea, vomiting, slurred speech, chest pain, shortness of breath, abdominal pain, weakness, numbness or tingling, etc...  Reviewed expectations Tiyanna: course of current medical issues. Questions answered. Outlined signs and symptoms indicating need for more acute intervention. Patient verbalized understanding. After Visit Summary given.   Lestine Box, PA-C 01/14/19 1107

## 2019-06-07 ENCOUNTER — Other Ambulatory Visit: Payer: Self-pay

## 2019-06-07 ENCOUNTER — Ambulatory Visit (HOSPITAL_COMMUNITY)
Admission: EM | Admit: 2019-06-07 | Discharge: 2019-06-07 | Disposition: A | Discharge status: Home / Self Care | Payer: Medicaid Other | Attending: Family Medicine | Admitting: Family Medicine

## 2019-06-07 DIAGNOSIS — R1310 Dysphagia, unspecified: Secondary | ICD-10-CM | POA: Insufficient documentation

## 2019-06-07 LAB — POCT URINALYSIS DIP (DEVICE)
Bilirubin Urine: NEGATIVE
Glucose, UA: NEGATIVE mg/dL
Ketones, ur: NEGATIVE mg/dL
Nitrite: NEGATIVE
Protein, ur: NEGATIVE mg/dL
Specific Gravity, Urine: <=1.005 (ref 1.005–1.030)
Urobilinogen, UA: 0.2 mg/dL (ref 0.0–1.0)
pH: 6.5 (ref 5.0–8.0)

## 2019-06-07 LAB — COMPREHENSIVE METABOLIC PANEL
ALT: 20 U/L (ref 0–44)
AST: 22 U/L (ref 15–41)
Albumin: 4.3 g/dL (ref 3.5–5.0)
Alkaline Phosphatase: 67 U/L (ref 38–126)
Anion gap: 11 (ref 5–15)
BUN: 17 mg/dL (ref 6–20)
CO2: 25 mmol/L (ref 22–32)
Calcium: 9.1 mg/dL (ref 8.9–10.3)
Chloride: 105 mmol/L (ref 98–111)
Creatinine, Ser: 0.7 mg/dL (ref 0.44–1.00)
GFR calc Af Amer: >60 mL/min (ref >60)
GFR calc non Af Amer: >60 mL/min (ref >60)
Glucose, Bld: 97 mg/dL (ref 70–99)
Potassium: 3.6 mmol/L (ref 3.5–5.1)
Sodium: 141 mmol/L (ref 135–145)
Total Bilirubin: 0.5 mg/dL (ref 0.3–1.2)
Total Protein: 7.5 g/dL (ref 6.5–8.1)

## 2019-06-07 LAB — CBC
HCT: 38.9 % (ref 36.0–46.0)
Hemoglobin: 12 g/dL (ref 12.0–15.0)
MCH: 23.5 pg — ABNORMAL LOW (ref 26.0–34.0)
MCHC: 30.8 g/dL (ref 30.0–36.0)
MCV: 76.1 fL — ABNORMAL LOW (ref 80.0–100.0)
Platelets: 169 10*3/uL (ref 150–400)
RBC: 5.11 MIL/uL (ref 3.87–5.11)
RDW: 13.8 % (ref 11.5–15.5)
WBC: 5.1 10*3/uL (ref 4.0–10.5)
nRBC: 0 % (ref 0.0–0.2)

## 2019-06-07 LAB — TSH: TSH: 1.065 u[IU]/mL (ref 0.350–4.500)

## 2019-06-07 MED ORDER — CETIRIZINE HCL 10 MG PO CAPS: 10.0000 mg | ORAL_CAPSULE | Freq: Every day | ORAL | 0 refills | Status: DC

## 2019-06-07 NOTE — ED Provider Notes (Signed)
[place]    CSN: [id number] Arrival date & time: 06/07/19  1253      History   Chief Complaint Chief Complaint  Patient presents with  . throat problem    HPI Interpretation via daughter, [place] interpretation not available through stratus or [facility] interpeters Marissa [name] is a 53 y.o. female history of thyroid cancer presenting today for evaluation of throat problem.  Patient states that over the past 3 months she has developed "noises" at nighttime when she lies down.  This keeps her from sleeping.  Occasionally will cause some mild shortness of breath.  Denies noises or wheezing within the chest.  Denies symptoms during the day.  Denies difficulty swallowing food or fluids.  Feels as if she may have congestion or mucus sitting in her throat.  Denies sore throat or throat pain.  Denies coughing.  Denies congestion or rhinorrhea.  Denies fevers or chills.  Of note patient's husband recently passed away of around onset and patient is now concerned that something may be wrong with her.  She is hoping for "labs".  Last followed up with PCP in March.  HPI  Past Medical History:  Diagnosis Date  . Cancer [facility])    THYROID   CANCER    Patient Active Problem List   Diagnosis Date Noted  . 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 Pericardial effusion 02/18/2013  . Hypothyroidism (acquired) 02/18/2013  . Hypokalemia 02/18/2013  . Pancytopenia ([place]) 02/18/2013  . Cancer [facility])     Past Surgical History:  Procedure Laterality Date  . THYROIDECTOMY  07/23/2012   Procedure: THYROIDECTOMY;  Surgeon: Izora Gala, MD;  Location[facility];  Service: ENT;  Laterality: Right;  Right thyroid lobectomy possible total   . THYROIDECTOMY  09/15/2012   Procedure: THYROIDECTOMY;  Surgeon: Izora Gala, MD;  Location: [place];  Service: ENT;  Laterality: Left;  COMPLETION OF THYROIDECTOMY    OB History   No obstetric history on file.      Home Medications    Prior to Admission medications    Medication Sig Start Date End Date Taking? Authorizing Provider  levothyroxine (SYNTHROID, LEVOTHROID) 100 MCG tablet Take 1 tablet (100 mcg total) by mouth daily before breakfast. 02/21/13  Yes Dellinger, Bobby Rumpf, PA-C  Cetirizine HCl 10 MG CAPS Take 1 capsule (10 mg total) by mouth daily. 06/07/19   Kailoni Vahle C, PA-C  gabapentin (NEURONTIN) 300 MG capsule 1 tab po at bedtime 1st day, 1 tablet bid second day, then 1 tablet tid 12/24/16   Melynda Ripple, MD  ibuprofen (ADVIL,MOTRIN) 600 MG tablet Take 1 tablet (600 mg total) by mouth every 6 (six) hours as needed. 12/24/16   Melynda Ripple, MD  lidocaine (LIDODERM) 5 % Place 1 patch onto the skin daily. Remove & Discard patch within 12 hours or as directed by MD 12/24/16   Melynda Ripple, MD  naproxen (NAPROSYN) 375 MG tablet Take 1 tablet (375 mg total) by mouth 2 (two) times daily. 01/14/19   Wurst, [place], PA-C  traMADol (ULTRAM) 50 MG tablet Take 1 tablet (50 mg total) by mouth every 6 (six) hours as needed. 12/24/16   Melynda Ripple, MD    Family History Family History  Family history unknown: Yes    Social History Social History   Tobacco Use  . Smoking status: Never Smoker  . Smokeless tobacco: Never Used  Substance Use Topics  . Alcohol use: No  . Drug use: No     Allergies   Patient  has no known allergies.   Review of Systems Review of Systems  Constitutional: Negative for activity change, appetite change, chills, fatigue and fever.  HENT: Positive for congestion and trouble swallowing. Negative for ear pain, rhinorrhea, sinus pressure, sore throat and voice change.   Eyes: Negative for discharge and redness.  Respiratory: Negative for cough, chest tightness and shortness of breath.   Cardiovascular: Negative for chest pain.  Gastrointestinal: Negative for abdominal pain, diarrhea, nausea and vomiting.  Musculoskeletal: Negative for myalgias.  Skin: Negative for rash.  Neurological: Negative for  dizziness, light-headedness and headaches.     Physical Exam Triage Vital Signs ED Triage Vitals  Enc Vitals Group     BP 06/07/19 1339 111/62     Pulse Rate 06/07/19 1339 82     Resp 06/07/19 1339 18     Temp 06/07/19 1339 97.6 F (36.4 C)     Temp Source 06/07/19 1339 Temporal     SpO2 06/07/19 1339 98 %     Weight --      Height --      Head Circumference --      Peak Flow --      Pain Score 06/07/19 1337 0     Pain Loc --      Pain Edu? --      Excl. in [place]? --    No data found.  Updated Vital Signs BP 111/62 (BP Location: Left Arm)   Pulse 82   Temp 97.6 F (36.4 C) (Temporal)   Resp 18   SpO2 98%   Visual Acuity Right Eye Distance:   Left Eye Distance:   Bilateral Distance:    Right Eye Near:   Left Eye Near:    Bilateral Near:     Physical Exam Vitals signs and nursing note reviewed.  Constitutional:      General: She is not in acute distress.    Appearance: She is well-developed.  HENT:     Head: Normocephalic and atraumatic.     Ears:     Comments: Bilateral ears without tenderness to palpation of external auricle, tragus and mastoid, EAC's without erythema or swelling, TM's with good bony landmarks and cone of light. Non erythematous.     Mouth/Throat:     Comments: Oral mucosa pink and moist, no tonsillar enlargement or exudate. Posterior pharynx patent and nonerythematous, no uvula deviation or swelling. Normal phonation. Eyes:     Conjunctiva/sclera: Conjunctivae normal.  Neck:     Musculoskeletal: Neck supple.     Comments: No lymphadenopathy, no palpable masses or deformities around thyroid area, normal elevation with swallowing Cardiovascular:     Rate and Rhythm: Normal rate and regular rhythm.     Heart sounds: No murmur.  Pulmonary:     Effort: Pulmonary effort is normal. No respiratory distress.     Breath sounds: Normal breath sounds.     Comments: Breathing comfortably at rest, CTABL, no wheezing, rales or other adventitious  sounds auscultated Abdominal:     Palpations: Abdomen is soft.     Tenderness: There is no abdominal tenderness.     Comments: Soft, nondistended, nontender to light and deep palpation throughout entire abdomen.  Skin:    General: Skin is warm and dry.  Neurological:     Mental Status: She is alert.      UC Treatments / Results  Labs (all labs ordered are listed, but only abnormal results are displayed) Labs Reviewed  POCT URINALYSIS DIP (DEVICE) - Abnormal;  Notable for the following components:      Result Value   Hgb urine dipstick TRACE (*)    Leukocytes,Ua TRACE (*)    All other components within normal limits  CBC  COMPREHENSIVE METABOLIC PANEL  TSH    EKG   Radiology No results found.  Procedures Procedures (including critical care time)  Medications Ordered in UC Medications - No data to display  Initial Impression / Assessment and Plan / UC Course  I have reviewed the triage vital signs and the nursing notes.  Pertinent labs & imaging results that were available during my care of the patient were reviewed by me and considered in my medical decision making (see chart for details).     Exam unremarkable.  Will do trial of cetirizine to help with any postnasal drainage/mucus in throat.  Obtaining CBC, CMP and TSH for basic labs for patient.  Recommended following up with ENT or gastroenterologist for further evaluation of throat issues for further imaging/scopes.  Follow-up with PCP.  Discussed strict return precautions. Patient verbalized understanding and is agreeable with plan.  Final Clinical Impressions(s) / UC Diagnoses   Final diagnoses:  Dysphagia, unspecified type     Discharge Instructions     We are checking basic labs, I will call if abnormal Begin trial of daily cetirizine before bed  Follow up with PCP, ENT or gastroenterology for further evaluation of symptoms    ED Prescriptions    Medication Sig Dispense Auth. Provider    Cetirizine HCl 10 MG CAPS Take 1 capsule (10 mg total) by mouth daily. 30 capsule Terrence Pizana C, PA-C     Controlled Substance Prescriptions [place] Controlled Substance Registry consulted? Not Applicable   Janith Lima, [place] 06/07/19 1433

## 2019-06-07 NOTE — ED Triage Notes (Signed)
Pt here for a "noise" that her throat makes at night that is associated with a some trouble breathing at night. She has no issues during the day.  She states this has been going on for 3-4 months.  Pt does have her own PCP, but she is worried since her husband passed away in 04-29-2023.  She is worried that there may be something wrong with her as well and she wants lab work.  They are also requesting a Covid test, just to be sure.

## 2019-06-07 NOTE — Discharge Instructions (Signed)
We are checking basic labs, I will call if abnormal Begin trial of daily cetirizine before bed  Follow up with PCP, ENT or gastroenterology for further evaluation of symptoms

## 2019-06-13 ENCOUNTER — Telehealth (HOSPITAL_COMMUNITY): Payer: Self-pay | Admitting: Emergency Medicine

## 2019-06-13 NOTE — Telephone Encounter (Signed)
Attempted to reach patient. No answer at this time.Call cannot be completed.

## 2019-07-25 ENCOUNTER — Ambulatory Visit (HOSPITAL_COMMUNITY)
Admission: EM | Admit: 2019-07-25 | Discharge: 2019-07-25 | Disposition: A | Discharge status: Home / Self Care | Payer: Medicaid Other | Attending: Family Medicine | Admitting: Family Medicine

## 2019-07-25 ENCOUNTER — Other Ambulatory Visit: Payer: Self-pay

## 2019-07-25 ENCOUNTER — Encounter (HOSPITAL_COMMUNITY): Payer: Self-pay

## 2019-07-25 DIAGNOSIS — W260XXA Contact with knife, initial encounter: Secondary | ICD-10-CM

## 2019-07-25 DIAGNOSIS — Z23 Encounter for immunization: Secondary | ICD-10-CM | POA: Diagnosis not present

## 2019-07-25 DIAGNOSIS — S81812A Laceration without foreign body, left lower leg, initial encounter: Secondary | ICD-10-CM

## 2019-07-25 MED ORDER — TETANUS-DIPHTH-ACELL PERTUSSIS 5-2.5-18.5 LF-MCG/0.5 IM SUSP
0.5000 mL | Freq: Once | INTRAMUSCULAR | Status: AC
  Administered 2019-07-25: 0.5 mL via INTRAMUSCULAR

## 2019-07-25 MED ORDER — LIDOCAINE-EPINEPHRINE (PF) 2 %-1:200000 IJ SOLN
INTRAMUSCULAR | Status: AC
  Filled 2019-07-25: qty 20

## 2019-07-25 MED ORDER — TETANUS-DIPHTH-ACELL PERTUSSIS 5-2.5-18.5 LF-MCG/0.5 IM SUSP
INTRAMUSCULAR | Status: AC
  Filled 2019-07-25: qty 0.5

## 2019-07-25 NOTE — ED Triage Notes (Signed)
Pt states she was taking the trash out and she cut her left leg today with a knife that was inside of the trash bag.

## 2019-07-25 NOTE — ED Provider Notes (Signed)
[place]    CSN: [id number] Arrival date & time: 07/25/19  1342      History   Chief Complaint Chief Complaint  Patient presents with  . Extremity Laceration    HPI Marissa [name] is a 53 y.o. female.   Patient is a 53 year old female who presents today for laceration to the left lower extremity.  This occurred prior to arrival.  She was taking the trash out and a sharp knife that was in the trash bag cut through it and cut her leg.  Leg was bandaged up and bleeding controlled.  Minimal pain at the site.  Dressed upon arrival.   ROS per HPI      Past Medical History:  Diagnosis Date  . Cancer [facility])    THYROID   CANCER    Patient Active Problem List   Diagnosis Date Noted  . 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 Pericardial effusion 02/18/2013  . Hypothyroidism (acquired) 02/18/2013  . Hypokalemia 02/18/2013  . Pancytopenia ([place]) 02/18/2013  . Cancer [facility])     Past Surgical History:  Procedure Laterality Date  . THYROIDECTOMY  07/23/2012   Procedure: THYROIDECTOMY;  Surgeon: Izora Gala, MD;  Location: [facility] OR;  Service: ENT;  Laterality: Right;  Right thyroid lobectomy possible total   . THYROIDECTOMY  09/15/2012   Procedure: THYROIDECTOMY;  Surgeon: Izora Gala, MD;  Location: [place];  Service: ENT;  Laterality: Left;  COMPLETION OF THYROIDECTOMY    OB History   No obstetric history on file.      Home Medications    Prior to Admission medications   Medication Sig Start Date End Date Taking? Authorizing Provider  Cetirizine HCl 10 MG CAPS Take 1 capsule (10 mg total) by mouth daily. 06/07/19   Wieters, Hallie C, PA-C  gabapentin (NEURONTIN) 300 MG capsule 1 tab po at bedtime 1st day, 1 tablet bid second day, then 1 tablet tid 12/24/16   Melynda Ripple, MD  ibuprofen (ADVIL,MOTRIN) 600 MG tablet Take 1 tablet (600 mg total) by mouth every 6 (six) hours as needed. 12/24/16   Melynda Ripple, MD  levothyroxine (SYNTHROID, LEVOTHROID) 100 MCG tablet Take 1 tablet  (100 mcg total) by mouth daily before breakfast. 02/21/13   Dellinger, Bobby Rumpf, PA-C  lidocaine (LIDODERM) 5 % Place 1 patch onto the skin daily. Remove & Discard patch within 12 hours or as directed by MD 12/24/16   Melynda Ripple, MD  naproxen (NAPROSYN) 375 MG tablet Take 1 tablet (375 mg total) by mouth 2 (two) times daily. 01/14/19   Wurst, [place], PA-C  traMADol (ULTRAM) 50 MG tablet Take 1 tablet (50 mg total) by mouth every 6 (six) hours as needed. 12/24/16   Melynda Ripple, MD    Family History Family History  Family history unknown: Yes    Social History Social History   Tobacco Use  . Smoking status: Never Smoker  . Smokeless tobacco: Never Used  Substance Use Topics  . Alcohol use: No  . Drug use: No     Allergies   Patient has no known allergies.   Review of Systems Review of Systems   Physical Exam Triage Vital Signs ED Triage Vitals  Enc Vitals Group     BP 07/25/19 1407 113/71     Pulse Rate 07/25/19 1407 75     Resp 07/25/19 1407 16     Temp 07/25/19 1407 (!) 97.5 F (36.4 C)     Temp Source 07/25/19 1407 Oral     SpO2  07/25/19 1407 100 %     Weight --      Height --      Head Circumference --      Peak Flow --      Pain Score 07/25/19 1402 2     Pain Loc --      Pain Edu? --      Excl. in [place]? --    No data found.  Updated Vital Signs BP 113/71 (BP Location: Right Arm)   Pulse 75   Temp (!) 97.5 F (36.4 C) (Oral)   Resp 16   SpO2 100%   Visual Acuity Right Eye Distance:   Left Eye Distance:   Bilateral Distance:    Right Eye Near:   Left Eye Near:    Bilateral Near:     Physical Exam Vitals signs and nursing note reviewed.  Constitutional:      General: She is not in acute distress.    Appearance: Normal appearance. She is not ill-appearing, toxic-appearing or diaphoretic.  HENT:     Head: Normocephalic.     Nose: Nose normal.     Mouth/Throat:     Pharynx: Oropharynx is clear.  Eyes:     Conjunctiva/sclera:  Conjunctivae normal.  Neck:     Musculoskeletal: Normal range of motion.  Pulmonary:     Effort: Pulmonary effort is normal.  Musculoskeletal: Normal range of motion.  Skin:    General: Skin is warm and dry.     Comments: Approximated 3 cm laceration to left lateral lower extremity.  Bleeding controlled.  Neurological:     Mental Status: She is alert.  Psychiatric:        Mood and Affect: Mood normal.      UC Treatments / Results  Labs (all labs ordered are listed, but only abnormal results are displayed) Labs Reviewed - No data to display  EKG   Radiology No results found.  Procedures Procedures (including critical care time)  Medications Ordered in UC Medications  Tdap (BOOSTRIX) injection 0.5 mL (0.5 mLs Intramuscular Given 07/25/19 1545)  lidocaine-EPINEPHrine (XYLOCAINE W/EPI) 2 %-1:200000 (PF) injection (has no administration in time range)  Tdap (BOOSTRIX) 5-2.5-18.5 LF-MCG/0.5 injection (has no administration in time range)    Initial Impression / Assessment and Plan / UC Course  I have reviewed the triage vital signs and the nursing notes.  Pertinent labs & imaging results that were available during my care of the patient were reviewed by me and considered in my medical decision making (see chart for details).     Left lower extremity laceration-.  Closed using 3 simple sutures Applied bacitracin and dressed Recommend follow-up in 7 to 10 days for suture removal Warning signs given for infection Tetanus updated Final Clinical Impressions(s) / UC Diagnoses   Final diagnoses:  Laceration of left lower extremity, initial encounter     Discharge Instructions     3 stiches put in the left lower extremity.  Tetanus shot given here  Keep clean and dry Return in 7-10 days for suture removal.  Watch for signs of infection to include redness, swelling , pain and drainage from the site.    ED Prescriptions    None     PDMP not reviewed this  encounter.   Orvan July, NP 07/25/19 2106

## 2019-07-25 NOTE — Discharge Instructions (Addendum)
3 stiches put in the left lower extremity.  Tetanus shot given here  Keep clean and dry Return in 7-10 days for suture removal.  Watch for signs of infection to include redness, swelling , pain and drainage from the site.

## 2019-08-01 ENCOUNTER — Encounter (HOSPITAL_COMMUNITY): Payer: Self-pay

## 2019-08-01 ENCOUNTER — Ambulatory Visit (HOSPITAL_COMMUNITY)
Admission: EM | Admit: 2019-08-01 | Discharge: 2019-08-01 | Disposition: A | Discharge status: Home / Self Care | Payer: Medicaid Other | Attending: Family Medicine | Admitting: Family Medicine

## 2019-08-01 ENCOUNTER — Other Ambulatory Visit: Payer: Self-pay

## 2019-08-01 DIAGNOSIS — Z4802 Encounter for removal of sutures: Secondary | ICD-10-CM

## 2019-08-01 NOTE — ED Notes (Signed)
Three sutures removed from left lower leg. Wound healing well.

## 2019-12-04 ENCOUNTER — Ambulatory Visit (HOSPITAL_COMMUNITY)
Admission: EM | Admit: 2019-12-04 | Discharge: 2019-12-04 | Disposition: A | Discharge status: Home / Self Care | Payer: Medicaid Other | Attending: Emergency Medicine | Admitting: Emergency Medicine

## 2019-12-04 ENCOUNTER — Encounter (HOSPITAL_COMMUNITY): Payer: Self-pay

## 2019-12-04 DIAGNOSIS — R8290 Unspecified abnormal findings in urine: Secondary | ICD-10-CM | POA: Diagnosis present

## 2019-12-04 DIAGNOSIS — R109 Unspecified abdominal pain: Secondary | ICD-10-CM | POA: Insufficient documentation

## 2019-12-04 LAB — POCT URINALYSIS DIP (DEVICE)
Bilirubin Urine: NEGATIVE
Glucose, UA: NEGATIVE mg/dL
Ketones, ur: NEGATIVE mg/dL
Nitrite: NEGATIVE
Protein, ur: 30 mg/dL — AB
Specific Gravity, Urine: 1.025 (ref 1.005–1.030)
Urobilinogen, UA: 0.2 mg/dL (ref 0.0–1.0)
pH: 7 (ref 5.0–8.0)

## 2019-12-04 MED ORDER — CEPHALEXIN 500 MG PO CAPS: 500.0000 mg | ORAL_CAPSULE | Freq: Two times a day (BID) | ORAL | 0 refills | Status: AC

## 2019-12-04 NOTE — ED Provider Notes (Addendum)
[place]    CSN: [id number] Arrival date & time: 12/04/19  1011      History   Chief Complaint Chief Complaint  Patient presents with  . Abdominal Pain    left side     HPI Marissa [name] is a 54 y.o. female.   Patient reports today accompanied by her daughter for 3-day history of left sided abdominal pain.  She reports the pain started on Thursday or Friday of last week.  She reports the pain is sharp with movement and seems to go towards her back on the left side.  She denies pain at rest.  Twisting and bending motions make the pain worse.  Pain is as bad as 8 out of 10.  She has tried Tylenol without relief.  She has applied Salonpas patches that have helped somewhat.  She denies fever, chills, nausea, vomiting, diarrhea.  She  denies painful urination, urinary frequency, urinary urgency.  Denies mid back pain.  Denies skin rash or color change along the pain.  She denies a specific inciting event to cause this pain.  It was reported a incident 1 week prior at work when she was lifting something and felt a pain and pop on her right side.  However that resolved in a day or two and she does not believe that is related to this current pain.  Denies any history abdominal surgeries.  He has never had a similar event to this.  And is denies history of diverticulitis.   Patient understands some [place] and daughter present for entire encounter and serves to clarify language.  Past Medical History:  Diagnosis Date  . Cancer [facility])    THYROID   CANCER    Patient Active Problem List   Diagnosis Date Noted  . 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 Pericardial effusion 02/18/2013  . Hypothyroidism (acquired) 02/18/2013  . Hypokalemia 02/18/2013  . Pancytopenia ([place]) 02/18/2013  . Cancer [facility])     Past Surgical History:  Procedure Laterality Date  . THYROIDECTOMY  07/23/2012   Procedure: THYROIDECTOMY;  Surgeon: Izora Gala, MD;  Location: [facility] OR;  Service: ENT;  Laterality: Right;  Right  thyroid lobectomy possible total   . THYROIDECTOMY  09/15/2012   Procedure: THYROIDECTOMY;  Surgeon: Izora Gala, MD;  Location: [place];  Service: ENT;  Laterality: Left;  COMPLETION OF THYROIDECTOMY    OB History   No obstetric history on file.      Home Medications    Prior to Admission medications   Medication Sig Start Date End Date Taking? Authorizing Provider  cephALEXin (KEFLEX) 500 MG capsule Take 1 capsule (500 mg total) by mouth 2 (two) times daily for 7 days. 12/04/19 12/11/19  Latricia Cerrito, Marguerita Beards, PA-C  Cetirizine HCl 10 MG CAPS Take 1 capsule (10 mg total) by mouth daily. 06/07/19   Wieters, Hallie C, PA-C  gabapentin (NEURONTIN) 300 MG capsule 1 tab po at bedtime 1st day, 1 tablet bid second day, then 1 tablet tid 12/24/16   Melynda Ripple, MD  ibuprofen (ADVIL,MOTRIN) 600 MG tablet Take 1 tablet (600 mg total) by mouth every 6 (six) hours as needed. 12/24/16   Melynda Ripple, MD  levothyroxine (SYNTHROID, LEVOTHROID) 100 MCG tablet Take 1 tablet (100 mcg total) by mouth daily before breakfast. 02/21/13   Dellinger, Bobby Rumpf, PA-C  lidocaine (LIDODERM) 5 % Place 1 patch onto the skin daily. Remove & Discard patch within 12 hours or as directed by MD 12/24/16   Melynda Ripple, MD  naproxen (NAPROSYN) 375 MG tablet Take 1 tablet (375 mg total) by mouth 2 (two) times daily. 01/14/19   Wurst, [place], PA-C  traMADol (ULTRAM) 50 MG tablet Take 1 tablet (50 mg total) by mouth every 6 (six) hours as needed. 12/24/16   Melynda Ripple, MD    Family History Family History  Family history unknown: Yes    Social History Social History   Tobacco Use  . Smoking status: Never Smoker  . Smokeless tobacco: Never Used  Substance Use Topics  . Alcohol use: No  . Drug use: No     Allergies   Patient has no known allergies.   Review of Systems Review of Systems  Constitutional: Negative for chills and fever.  HENT: Negative.   Respiratory: Negative for cough and shortness of  breath.   Cardiovascular: Negative for chest pain and palpitations.  Gastrointestinal: Positive for abdominal pain. Negative for blood in stool, constipation, diarrhea, nausea, rectal pain and vomiting.  Genitourinary: Positive for flank pain. Negative for dysuria, frequency, pelvic pain, urgency, vaginal bleeding, vaginal discharge and vaginal pain.  Musculoskeletal: Positive for back pain. Negative for arthralgias and myalgias.  Skin: Negative for color change and rash.  Neurological: Negative for dizziness and headaches.     Physical Exam Triage Vital Signs ED Triage Vitals  Enc Vitals Group     BP 12/04/19 1101 126/79     Pulse Rate 12/04/19 1101 61     Resp 12/04/19 1101 16     Temp 12/04/19 1101 98.1 F (36.7 C)     Temp Source 12/04/19 1101 Oral     SpO2 12/04/19 1101 100 %     Weight --      Height --      Head Circumference --      Peak Flow --      Pain Score 12/04/19 1102 10     Pain Loc --      Pain Edu? --      Excl. in [place]? --    No data found.  Updated Vital Signs BP 126/79 (BP Location: Right Arm)   Pulse 61   Temp 98.1 F (36.7 C) (Oral)   Resp 16   SpO2 100%   Visual Acuity Right Eye Distance:   Left Eye Distance:   Bilateral Distance:    Right Eye Near:   Left Eye Near:    Bilateral Near:     Physical Exam Vitals and nursing note reviewed.  Constitutional:      General: She is not in acute distress.    Appearance: She is well-developed. She is not ill-appearing.  HENT:     Head: Normocephalic and atraumatic.  Eyes:     Conjunctiva/sclera: Conjunctivae normal.  Cardiovascular:     Rate and Rhythm: Normal rate and regular rhythm.     Heart sounds: No murmur. No friction rub. No gallop.   Pulmonary:     Effort: Pulmonary effort is normal. No respiratory distress.     Breath sounds: Normal breath sounds. No wheezing, rhonchi or rales.  Abdominal:     General: Abdomen is flat. Bowel sounds are normal. There is no distension.      Palpations: Abdomen is soft. There is no hepatomegaly or splenomegaly.     Tenderness: There is abdominal tenderness (Tenderness along left oblique and lateral rectus musculature in the left flank and mid abdomen.  Patient reports pain is much worse with abdominal flexion when sitting up.). There is no right CVA tenderness,  left CVA tenderness, guarding or rebound.  Musculoskeletal:     Cervical back: Neck supple.  Skin:    General: Skin is warm and dry.  Neurological:     General: No focal deficit present.     Mental Status: She is alert and oriented to person, place, and time.  Psychiatric:        Mood and Affect: Mood normal.        Behavior: Behavior normal.      UC Treatments / Results  Labs (all labs ordered are listed, but only abnormal results are displayed) Labs Reviewed  POCT URINALYSIS DIP (DEVICE) - Abnormal; Notable for the following components:      Result Value   Hgb urine dipstick MODERATE (*)    Protein, ur 30 (*)    Leukocytes,Ua SMALL (*)    All other components within normal limits  URINE CULTURE    EKG   Radiology No results found.  Procedures Procedures (including critical care time)  Medications Ordered in UC Medications - No data to display  Initial Impression / Assessment and Plan / UC Course  I have reviewed the triage vital signs and the nursing notes.  Pertinent labs & imaging results that were available during my care of the patient were reviewed by me and considered in my medical decision making (see chart for details).     #Abnormal urinalysis #Left flank pain. Patient is a 54 year old female patient with history of thyroid cancer status post thyroidectomy presenting today with left-sided abdominal and back pain.  She is afebrile and hemodynamically stable.  Urinalysis did reveal moderate blood and small leukocytes.  She denies urinary symptoms however given tenderness will treat with Keflex for concern of cystitis.  Given she is  afebrile and not ill-appearing do not believe this is pyelonephritis at this time.  Also on the differential would be a kidney stone however pain is positional and more so elicited on abdominal flexion. -Strict emergency department precautions given with severe pain or development of high fever.  Patient's daughter and patient report understanding of this plan.  To return urgent care if symptoms persist beyond 2 days -Recommended continue Tylenol and Salonpas patches for pain relief at this time. -Keflex twice daily for 7 days  Final Clinical Impressions(s) / UC Diagnoses   Final diagnoses:  Abnormal urinalysis  Left flank pain     Discharge Instructions     I want you to begin the Keflex today and take this twice a day for the next 7 days.  I want you to drink plenty of fluids.  And if your pain gets worse, you develop fever or have painful urination I want you to return to the clinic for reevaluation.  If your pain is not improving in a few days I would also like for you to come back for reevaluation.  I want you to continue to utilize the Salonpas patches and Tylenol for pain.  Avoid ibuprofen or Advil  Pain becomes severe and he developed high fever I would like for you to go to the emergency department      ED Prescriptions    Medication Sig Dispense Auth. Provider   cephALEXin (KEFLEX) 500 MG capsule Take 1 capsule (500 mg total) by mouth 2 (two) times daily for 7 days. 14 capsule Hashem Goynes, Marguerita Beards, PA-C     PDMP not reviewed this encounter.   Purnell Shoemaker, PA-C 12/04/19 1219    Jaedan Schuman, Marguerita Beards, PA-C 12/04/19 1234

## 2019-12-04 NOTE — Discharge Instructions (Addendum)
I want you to begin the Keflex today and take this twice a day for the next 7 days.  I want you to drink plenty of fluids.  And if your pain gets worse, you develop fever or have painful urination I want you to return to the clinic for reevaluation.  If your pain is not improving in a few days I would also like for you to come back for reevaluation.  I want you to continue to utilize the Salonpas patches and Tylenol for pain.  Avoid ibuprofen or Advil  Pain becomes severe and he developed high fever I would like for you to go to the emergency department

## 2019-12-04 NOTE — ED Triage Notes (Signed)
Pt present left lower abdominal pain, symptoms started 3 days ago. Pt was at working attempting to pick up something and heard a pop on the left side of stomach that moved to her back .

## 2019-12-05 LAB — URINE CULTURE: Culture: 60000 — AB

## 2019-12-13 ENCOUNTER — Ambulatory Visit (HOSPITAL_COMMUNITY)
Admission: EM | Admit: 2019-12-13 | Discharge: 2019-12-13 | Disposition: A | Discharge status: Home / Self Care | Payer: Medicaid Other | Attending: Physician Assistant | Admitting: Physician Assistant

## 2019-12-13 ENCOUNTER — Encounter (HOSPITAL_COMMUNITY): Payer: Self-pay

## 2019-12-13 ENCOUNTER — Other Ambulatory Visit: Payer: Self-pay

## 2019-12-13 DIAGNOSIS — R109 Unspecified abdominal pain: Secondary | ICD-10-CM | POA: Diagnosis present

## 2019-12-13 DIAGNOSIS — R8290 Unspecified abnormal findings in urine: Secondary | ICD-10-CM | POA: Diagnosis present

## 2019-12-13 LAB — POCT URINALYSIS DIP (DEVICE)
Bilirubin Urine: NEGATIVE
Glucose, UA: NEGATIVE mg/dL
Ketones, ur: NEGATIVE mg/dL
Nitrite: NEGATIVE
Protein, ur: NEGATIVE mg/dL
Specific Gravity, Urine: 1.02 (ref 1.005–1.030)
Urobilinogen, UA: 0.2 mg/dL (ref 0.0–1.0)
pH: 5.5 (ref 5.0–8.0)

## 2019-12-13 NOTE — ED Provider Notes (Signed)
[place]    CSN: [id number] Arrival date & time: 12/13/19  1113      History   Chief Complaint Chief Complaint  Patient presents with  . Medication Refill    HPI Marissa [name] is a 54 y.o. female.   Patient reports to urgent care today accompanied by daughter for continued left side pain.  She was evaluated on 12/04/2019 for left flank pain and had abnormal urinalysis with moderate blood and leukocytes.  She was treated for cystitis at that time with Keflex twice daily for 7 days.  She reports she has had improved pain from that time and it is currently a 2-4 out of 10.  She reports that certain movements make this worse such as bending to the side or forward.  She reports at the time of last visit pain was 7 or 8 out of 10.  She  continue to use Tylenol and took all the Keflex.  Continues to have no fevers and chills.  She continues to deny painful urination, frequency urination or urgency.   Daughter here today does state the patient did have a remote history of kidney stone.  And this is one of her concerns today.   Patient speaks a dialect of [place] that is not well translated and has her daughter at bedside for translation purposes.     Past Medical History:  Diagnosis Date  . Cancer [facility])    THYROID   CANCER    Patient Active Problem List   Diagnosis Date Noted  . 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 Pericardial effusion 02/18/2013  . Hypothyroidism (acquired) 02/18/2013  . Hypokalemia 02/18/2013  . Pancytopenia ([place]) 02/18/2013  . Cancer [facility])     Past Surgical History:  Procedure Laterality Date  . THYROIDECTOMY  07/23/2012   Procedure: THYROIDECTOMY;  Surgeon: Izora Gala, MD;  Location: [facility] OR;  Service: ENT;  Laterality: Right;  Right thyroid lobectomy possible total   . THYROIDECTOMY  09/15/2012   Procedure: THYROIDECTOMY;  Surgeon: Izora Gala, MD;  Location: [place];  Service: ENT;  Laterality: Left;  COMPLETION OF THYROIDECTOMY    OB History   No  obstetric history on file.      Home Medications    Prior to Admission medications   Medication Sig Start Date End Date Taking? Authorizing Provider  Cetirizine HCl 10 MG CAPS Take 1 capsule (10 mg total) by mouth daily. 06/07/19   Wieters, Hallie C, PA-C  gabapentin (NEURONTIN) 300 MG capsule 1 tab po at bedtime 1st day, 1 tablet bid second day, then 1 tablet tid 12/24/16   Melynda Ripple, MD  ibuprofen (ADVIL,MOTRIN) 600 MG tablet Take 1 tablet (600 mg total) by mouth every 6 (six) hours as needed. 12/24/16   Melynda Ripple, MD  levothyroxine (SYNTHROID, LEVOTHROID) 100 MCG tablet Take 1 tablet (100 mcg total) by mouth daily before breakfast. 02/21/13   Dellinger, Bobby Rumpf, PA-C  lidocaine (LIDODERM) 5 % Place 1 patch onto the skin daily. Remove & Discard patch within 12 hours or as directed by MD 12/24/16   Melynda Ripple, MD  naproxen (NAPROSYN) 375 MG tablet Take 1 tablet (375 mg total) by mouth 2 (two) times daily. 01/14/19   Wurst, [place], PA-C  traMADol (ULTRAM) 50 MG tablet Take 1 tablet (50 mg total) by mouth every 6 (six) hours as needed. 12/24/16   Melynda Ripple, MD    Family History Family History  Family history unknown: Yes    Social History Social History  Tobacco Use  . Smoking status: Never Smoker  . Smokeless tobacco: Never Used  Substance Use Topics  . Alcohol use: No  . Drug use: No     Allergies   Patient has no known allergies.   Review of Systems Review of Systems  Constitutional: Negative for chills, fatigue and fever.  Eyes: Negative for visual disturbance.  Respiratory: Negative for cough and shortness of breath.   Cardiovascular: Negative for chest pain and palpitations.  Gastrointestinal: Positive for abdominal pain. Negative for diarrhea, nausea and vomiting.  Genitourinary: Positive for flank pain. Negative for dysuria, frequency, hematuria, pelvic pain and urgency.  Musculoskeletal: Negative for arthralgias, back pain and  myalgias.  Skin: Negative for color change and rash.  Neurological: Negative for seizures, syncope and headaches.  All other systems reviewed and are negative.    Physical Exam Triage Vital Signs ED Triage Vitals [12/13/19 1147]  Enc Vitals Group     BP 116/67     Pulse Rate 63     Resp 16     Temp 97.8 F (36.6 C)     Temp Source Oral     SpO2 100 %     Weight      Height      Head Circumference      Peak Flow      Pain Score      Pain Loc      Pain Edu?      Excl. in [place]?    No data found.  Updated Vital Signs BP 116/67 (BP Location: Right Arm)   Pulse 63   Temp 97.8 F (36.6 C) (Oral)   Resp 16   SpO2 100%   Visual Acuity Right Eye Distance:   Left Eye Distance:   Bilateral Distance:    Right Eye Near:   Left Eye Near:    Bilateral Near:     Physical Exam Vitals and nursing note reviewed.  Constitutional:      General: She is not in acute distress.    Appearance: Normal appearance. She is well-developed. She is not ill-appearing.  HENT:     Head: Normocephalic and atraumatic.  Eyes:     Conjunctiva/sclera: Conjunctivae normal.  Cardiovascular:     Rate and Rhythm: Normal rate and regular rhythm.     Heart sounds: No murmur.  Pulmonary:     Effort: Pulmonary effort is normal. No respiratory distress.     Breath sounds: Normal breath sounds.  Abdominal:     Palpations: Abdomen is soft.     Tenderness: There is abdominal tenderness (Left flank and left lower quadrant.  Patient reports 4 out of 10 with deep palpation). There is no right CVA tenderness or left CVA tenderness.  Musculoskeletal:     Cervical back: Neck supple.     Comments: Patient has full range of motion of the torso with reported pain  with lateral bending and forward flexion  Skin:    General: Skin is warm and dry.  Neurological:     Mental Status: She is alert.      UC Treatments / Results  Labs (all labs ordered are listed, but only abnormal results are displayed) Labs  Reviewed  POCT URINALYSIS DIP (DEVICE) - Abnormal; Notable for the following components:      Result Value   Hgb urine dipstick MODERATE (*)    Leukocytes,Ua TRACE (*)    All other components within normal limits  URINE CULTURE    EKG  Radiology No results found.  Procedures Procedures (including critical care time)  Medications Ordered in UC Medications - No data to display  Initial Impression / Assessment and Plan / UC Course  I have reviewed the triage vital signs and the nursing notes.  Pertinent labs & imaging results that were available during my care of the patient were reviewed by me and considered in my medical decision making (see chart for details).     #Flank pain Patient 54 year old presenting with left-sided abdominal and flank pain that is been present for several days.  She was seen and evaluated in this urgent care on 12/04/2019 persists more severe flank pain had an abnormal urinalysis with moderate blood and small leukocytes.  Culture revealed 60,000 colony units of group B strep.  She was treated with Keflex for 7 days.  She had improvement in symptoms but has continued to to 2-4/10 positional left-sided pain.  She continues to not have urinary symptoms.  UA with continued moderate blood and trace leukocytes day.  -Given improvement in symptoms and positional pain continued low suspicion of kidney stone.  Group B strep is susceptible to Keflex and believe that this would have covered well.  Will send urine culture and delay treatment pending those results.  Recommended and instructed patient to follow-up with [facility] for primary care to follow urinalysis and repeat. -Emergency department precautions for increased pain or fever development.  Patient and daughter understand these instructions. -Continue Tylenol and topical relief agents  Final Clinical Impressions(s) / UC Diagnoses   Final diagnoses:  Abnormal urinalysis  Left flank pain       Discharge Instructions     Your urinalysis was abnormal  Continue taking Tylenol and utilizing a Salonpas.  I sent a urine culture and will wait for these results for any further treatment  Like for you to follow-up with your primary care for monitoring of your left-sided pain and further work-up and repeat urinalysis.  If you develop worsening left-sided pain or fever I would like you to go to the emergency department     ED Prescriptions    None     PDMP not reviewed this encounter.   Purnell Shoemaker, PA-C 12/13/19 1311

## 2019-12-13 NOTE — Discharge Instructions (Addendum)
Your urinalysis was abnormal  Continue taking Tylenol and utilizing a Salonpas.  I sent a urine culture and will wait for these results for any further treatment  Like for you to follow-up with your primary care for monitoring of your left-sided pain and further work-up and repeat urinalysis.  If you develop worsening left-sided pain or fever I would like you to go to the emergency department

## 2019-12-13 NOTE — ED Triage Notes (Signed)
Pt is needs a refill on medication and work note. Pt would like to be put on light duty at work. She is still having pain in her lower abdominal area from previous visit on 12/04/19

## 2019-12-14 LAB — URINE CULTURE: Culture: <10000 — AB

## 2019-12-21 ENCOUNTER — Other Ambulatory Visit: Payer: Self-pay | Admitting: Endocrinology

## 2019-12-21 DIAGNOSIS — C73 Malignant neoplasm of thyroid gland: Secondary | ICD-10-CM

## 2020-01-30 ENCOUNTER — Encounter (HOSPITAL_COMMUNITY): Payer: Self-pay

## 2020-01-30 ENCOUNTER — Ambulatory Visit (HOSPITAL_COMMUNITY)
Admission: EM | Admit: 2020-01-30 | Discharge: 2020-01-30 | Disposition: A | Discharge status: Home / Self Care | Payer: Medicaid Other | Attending: Family Medicine | Admitting: Family Medicine

## 2020-01-30 ENCOUNTER — Other Ambulatory Visit: Payer: Self-pay

## 2020-01-30 DIAGNOSIS — M545 Low back pain, unspecified: Secondary | ICD-10-CM

## 2020-01-30 DIAGNOSIS — R8290 Unspecified abnormal findings in urine: Secondary | ICD-10-CM | POA: Diagnosis present

## 2020-01-30 LAB — POCT URINALYSIS DIP (DEVICE)
Bilirubin Urine: NEGATIVE
Glucose, UA: NEGATIVE mg/dL
Ketones, ur: NEGATIVE mg/dL
Nitrite: NEGATIVE
Protein, ur: 30 mg/dL — AB
Specific Gravity, Urine: 1.025 (ref 1.005–1.030)
Urobilinogen, UA: 0.2 mg/dL (ref 0.0–1.0)
pH: 6.5 (ref 5.0–8.0)

## 2020-01-30 MED ORDER — NAPROXEN 500 MG PO TABS: 500.0000 mg | ORAL_TABLET | Freq: Two times a day (BID) | ORAL | 0 refills | Status: DC

## 2020-01-30 MED ORDER — TRAMADOL HCL 50 MG PO TABS: 50.0000 mg | ORAL_TABLET | Freq: Four times a day (QID) | ORAL | 0 refills | Status: DC | PRN

## 2020-01-30 NOTE — Discharge Instructions (Signed)
You are likely experiencing another kidney stone, as well as a low back muscle strain.  I have sent in naproxen for you to take twice daily as needed for inflammation.  I have also sent in tramadol for you to take once every 6 hours as needed for moderate to severe pain.  Follow-up with primary care physician as needed.

## 2020-01-30 NOTE — ED Triage Notes (Addendum)
Pt presents to UC with lower back pain x 1 week. 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 .Pt reports Tylenol relief somewhat the pain.

## 2020-01-30 NOTE — ED Provider Notes (Signed)
Marissa [name]    CSN: [id number] Arrival date & time: 01/30/20  1020      History   Chief Complaint Chief Complaint  Patient presents with  . Back Pain    HPI Marissa [name] is a 54 y.o. female.   Patient presents with her daughter to the visit today.  Patient is requesting that the daughter be her interpreter, as they speak a dialect of [place] which is not easily translated.  Patient presents with right-sided low back pain for the last week.  Has taken Tylenol with some relief.  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  Patient has been seen once in March and once in February in this office for somewhat similar symptoms of flank pain with abnormal urinalysis, but no UTI at her last visit.  Requesting refill for anti-inflammatory medication today.  Denies headache, sore throat, nausea, vomiting, diarrhea, rash, fever, other symptoms.  Patient has medical history significant for hypothyroidism.  ROS per HPI  The history is provided by the patient and a caregiver.  Back Pain   Past Medical History:  Diagnosis Date  . Cancer [facility])    THYROID   CANCER    Patient Active Problem List   Diagnosis Date Noted  . 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 Pericardial effusion 02/18/2013  . Hypothyroidism (acquired) 02/18/2013  . Hypokalemia 02/18/2013  . Pancytopenia ([place]) 02/18/2013  . Cancer [facility])     Past Surgical History:  Procedure Laterality Date  . THYROIDECTOMY  07/23/2012   Procedure: THYROIDECTOMY;  Surgeon: Izora Gala, MD;  Location: [facility] OR;  Service: ENT;  Laterality: Right;  Right thyroid lobectomy possible total   . THYROIDECTOMY  09/15/2012   Procedure: THYROIDECTOMY;  Surgeon: Izora Gala, MD;  Location: [place];  Service: ENT;  Laterality: Left;  COMPLETION OF THYROIDECTOMY    OB History   No obstetric history on file.      Home Medications    Prior to Admission medications   Medication Sig Start  Date End Date Taking? Authorizing Provider  acetaminophen (TYLENOL) 500 MG tablet Take 500 mg by mouth every 6 (six) hours as needed.   Yes [provider]  Cetirizine HCl 10 MG CAPS Take 1 capsule (10 mg total) by mouth daily. 06/07/19   Wieters, Hallie C, PA-C  gabapentin (NEURONTIN) 300 MG capsule 1 tab po at bedtime 1st day, 1 tablet bid second day, then 1 tablet tid 12/24/16   Melynda Ripple, MD  ibuprofen (ADVIL,MOTRIN) 600 MG tablet Take 1 tablet (600 mg total) by mouth every 6 (six) hours as needed. 12/24/16   Melynda Ripple, MD  levothyroxine (SYNTHROID, LEVOTHROID) 100 MCG tablet Take 1 tablet (100 mcg total) by mouth daily before breakfast. 02/21/13   Dellinger, Bobby Rumpf, PA-C  lidocaine (LIDODERM) 5 % Place 1 patch onto the skin daily. Remove & Discard patch within 12 hours or as directed by MD 12/24/16   Melynda Ripple, MD  naproxen (NAPROSYN) 500 MG tablet Take 1 tablet (500 mg total) by mouth 2 (two) times daily. 01/30/20   Faustino Congress, NP  traMADol (ULTRAM) 50 MG tablet Take 1 tablet (50 mg total) by mouth every 6 (six) hours as needed. 01/30/20   Faustino Congress, NP    Family History Family History  Family history unknown: Yes    Social History Social History   Tobacco Use  . Smoking status: Never Smoker  . Smokeless tobacco:  Never Used  Substance Use Topics  . Alcohol use: No  . Drug use: No     Allergies   Patient has no known allergies.   Review of Systems Review of Systems  Musculoskeletal: Positive for back pain.     Physical Exam Triage Vital Signs ED Triage Vitals [01/30/20 1035]  Enc Vitals Group     BP      Pulse      Resp      Temp      Temp src      SpO2      Weight      Height      Head Circumference      Peak Flow      Pain Score 6     Pain Loc      Pain Edu?      Excl. in [place]?    No data found.  Updated Vital Signs BP 104/67 (BP Location: Right Arm)   Pulse 67   Temp 98.2 F (36.8 C) (Oral)   Resp 16    SpO2 97%   Visual Acuity Right Eye Distance:   Left Eye Distance:   Bilateral Distance:    Right Eye Near:   Left Eye Near:    Bilateral Near:     Physical Exam Vitals and nursing note reviewed.  Constitutional:      General: She is not in acute distress.    Appearance: Normal appearance. She is well-developed and normal weight. She is not ill-appearing.  HENT:     Head: Normocephalic and atraumatic.  Eyes:     Conjunctiva/sclera: Conjunctivae normal.  Cardiovascular:     Rate and Rhythm: Normal rate and regular rhythm.     Heart sounds: Normal heart sounds. No murmur.  Pulmonary:     Effort: Pulmonary effort is normal. No respiratory distress.     Breath sounds: Normal breath sounds. No stridor. No wheezing, rhonchi or rales.  Chest:     Chest wall: No tenderness.  Abdominal:     Palpations: Abdomen is soft.     Tenderness: There is no abdominal tenderness.  Musculoskeletal:        General: Tenderness (Right low back tender to deep palpation.) present.     Cervical back: Normal range of motion and neck supple. No rigidity or tenderness.  Skin:    General: Skin is warm and dry.  Neurological:     General: No focal deficit present.     Mental Status: She is alert and oriented to person, place, and time.  Psychiatric:        Mood and Affect: Mood normal.        Behavior: Behavior normal.        Thought Content: Thought content normal.      UC Treatments / Results  Labs (all labs ordered are listed, but only abnormal results are displayed) Labs Reviewed  POCT URINALYSIS DIP (DEVICE) - Abnormal; Notable for the following components:      Result Value   Hgb urine dipstick MODERATE (*)    Protein, ur 30 (*)    Leukocytes,Ua SMALL (*)    All other components within normal limits  URINE CULTURE    EKG   Radiology No results found.  Procedures Procedures (including critical care time)  Medications Ordered in UC Medications - No data to  display  Initial Impression / Assessment and Plan / UC Course  I have reviewed the triage vital signs and the nursing  notes.  Pertinent labs & imaging results that were available during my care of the patient were reviewed by me and considered in my medical decision making (see chart for details).     Low back pain: Presents with right-sided low back pain for the last week.  Is tender upon deep palpation.  Pain with bending and twisting.  UA positive for blood, protein, leuks.  Will culture to be sure there is no infection.  Most likely muscular related versus kidney stone as patient has history of kidney stones.  We we will go ahead and prescribe naproxen 500 mg twice daily as needed for inflammation and tramadol 50 mg 1 tablet every 6 hours as needed for moderate to severe pain.  Work note provided for patient to perform light duty if possible, no lifting over 15 pounds for the next week.  If symptoms are not improving after this, follow-up with primary care with this office as needed.  To the ER for trouble swallowing, trouble breathing, other concerning symptoms.  Patient and daughter verbalize agreement, agree with treatment plan. Final Clinical Impressions(s) / UC Diagnoses   Final diagnoses:  Acute right-sided low back pain, unspecified whether sciatica present  Abnormal urinalysis     Discharge Instructions     You are likely experiencing another kidney stone, as well as a low back muscle strain.  I have sent in naproxen for you to take twice daily as needed for inflammation.  I have also sent in tramadol for you to take once every 6 hours as needed for moderate to severe pain.  Follow-up with primary care physician as needed.    ED Prescriptions    Medication Sig Dispense Auth. Provider   naproxen (NAPROSYN) 500 MG tablet Take 1 tablet (500 mg total) by mouth 2 (two) times daily. 30 tablet Faustino Congress, NP   traMADol (ULTRAM) 50 MG tablet Take 1 tablet (50 mg total)  by mouth every 6 (six) hours as needed. 15 tablet Faustino Congress, NP     I have reviewed the PDMP during this encounter.   Faustino Congress, NP 01/30/20 1121

## 2020-01-31 LAB — URINE CULTURE: Culture: NO GROWTH

## 2020-02-15 ENCOUNTER — Encounter (HOSPITAL_COMMUNITY): Payer: Self-pay

## 2020-02-15 ENCOUNTER — Ambulatory Visit (HOSPITAL_COMMUNITY)
Admission: EM | Admit: 2020-02-15 | Discharge: 2020-02-15 | Disposition: A | Discharge status: Home / Self Care | Payer: Medicaid Other | Attending: Family Medicine | Admitting: Family Medicine

## 2020-02-15 ENCOUNTER — Ambulatory Visit (INDEPENDENT_AMBULATORY_CARE_PROVIDER_SITE_OTHER): Payer: Medicaid Other

## 2020-02-15 ENCOUNTER — Other Ambulatory Visit: Payer: Self-pay

## 2020-02-15 DIAGNOSIS — K5900 Constipation, unspecified: Secondary | ICD-10-CM | POA: Insufficient documentation

## 2020-02-15 DIAGNOSIS — N200 Calculus of kidney: Secondary | ICD-10-CM | POA: Insufficient documentation

## 2020-02-15 DIAGNOSIS — R1010 Upper abdominal pain, unspecified: Secondary | ICD-10-CM | POA: Insufficient documentation

## 2020-02-15 DIAGNOSIS — R109 Unspecified abdominal pain: Secondary | ICD-10-CM | POA: Diagnosis not present

## 2020-02-15 DIAGNOSIS — Z85850 Personal history of malignant neoplasm of thyroid: Secondary | ICD-10-CM | POA: Diagnosis not present

## 2020-02-15 LAB — POCT URINALYSIS DIP (DEVICE)
Bilirubin Urine: NEGATIVE
Glucose, UA: NEGATIVE mg/dL
Ketones, ur: NEGATIVE mg/dL
Nitrite: NEGATIVE
Protein, ur: 30 mg/dL — AB
Specific Gravity, Urine: 1.02 (ref 1.005–1.030)
Urobilinogen, UA: 0.2 mg/dL (ref 0.0–1.0)
pH: 7 (ref 5.0–8.0)

## 2020-02-15 LAB — POC URINE PREG, ED: Preg Test, Ur: NEGATIVE

## 2020-02-15 MED ORDER — POLYETHYLENE GLYCOL 3350 17 G PO PACK: 17.0000 g | PACK | Freq: Every day | ORAL | 0 refills | Status: DC

## 2020-02-15 MED ORDER — OMEPRAZOLE 20 MG PO CPDR
20.0000 mg | DELAYED_RELEASE_CAPSULE | Freq: Every day | ORAL | 1 refills | Status: DC
Start: 2020-02-15 — End: 2020-09-17

## 2020-02-15 NOTE — ED Provider Notes (Signed)
[place]    CSN: [id number] Arrival date & time: 02/15/20  1013      History   Chief Complaint Chief Complaint  Patient presents with  . Abdominal Pain    HPI Marissa [name] is a 54 y.o. female.   Patient is a 54 year old female with past medical history of thyroid cancer.  She presents today with upper abdominal pain that has been constant, waxing waning over the past week.  Describes as cool sensation in stomach.  Feels like "air in her stomach".  Reporting less than it was yesterday and does not feel constipated.  No associated nausea, vomiting or diarrhea.  No fever, chills or body aches.  No dysuria, hematuria or urinary frequency.  Eating does not necessarily make the problem better or worse.  She does eat a lot of spicy food.  ROS per HPI      Past Medical History:  Diagnosis Date  . Cancer [facility])    THYROID   CANCER    Patient Active Problem List   Diagnosis Date Noted  . 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 Pericardial effusion 02/18/2013  . Hypothyroidism (acquired) 02/18/2013  . Hypokalemia 02/18/2013  . Pancytopenia ([place]) 02/18/2013  . Cancer [facility])     Past Surgical History:  Procedure Laterality Date  . THYROIDECTOMY  07/23/2012   Procedure: THYROIDECTOMY;  Surgeon: Izora Gala, MD;  Location: [facility] OR;  Service: ENT;  Laterality: Right;  Right thyroid lobectomy possible total   . THYROIDECTOMY  09/15/2012   Procedure: THYROIDECTOMY;  Surgeon: Izora Gala, MD;  Location: [place];  Service: ENT;  Laterality: Left;  COMPLETION OF THYROIDECTOMY    OB History   No obstetric history on file.      Home Medications    Prior to Admission medications   Medication Sig Start Date End Date Taking? Authorizing Provider  acetaminophen (TYLENOL) 500 MG tablet Take 500 mg by mouth every 6 (six) hours as needed.    [provider]  Cetirizine HCl 10 MG CAPS Take 1 capsule (10 mg total) by mouth daily. 06/07/19   Wieters, Hallie C, PA-C  gabapentin (NEURONTIN)  300 MG capsule 1 tab po at bedtime 1st day, 1 tablet bid second day, then 1 tablet tid 12/24/16   Melynda Ripple, MD  ibuprofen (ADVIL,MOTRIN) 600 MG tablet Take 1 tablet (600 mg total) by mouth every 6 (six) hours as needed. 12/24/16   Melynda Ripple, MD  levothyroxine (SYNTHROID, LEVOTHROID) 100 MCG tablet Take 1 tablet (100 mcg total) by mouth daily before breakfast. 02/21/13   Dellinger, Bobby Rumpf, PA-C  lidocaine (LIDODERM) 5 % Place 1 patch onto the skin daily. Remove & Discard patch within 12 hours or as directed by MD 12/24/16   Melynda Ripple, MD  naproxen (NAPROSYN) 500 MG tablet Take 1 tablet (500 mg total) by mouth 2 (two) times daily. 01/30/20   Faustino Congress, NP  omeprazole (PRILOSEC) 20 MG capsule Take 1 capsule (20 mg total) by mouth daily. 02/15/20   Zackarie Chason, Tressia Miners A, NP  polyethylene glycol (MIRALAX / GLYCOLAX) 17 g packet Take 17 g by mouth daily. 02/15/20   Loura Halt A, NP  traMADol (ULTRAM) 50 MG tablet Take 1 tablet (50 mg total) by mouth every 6 (six) hours as needed. 01/30/20   Faustino Congress, NP    Family History Family History  Family history unknown: Yes    Social History Social History   Tobacco Use  . Smoking status: Never Smoker  . Smokeless tobacco: Never  Used  Substance Use Topics  . Alcohol use: No  . Drug use: No     Allergies   Patient has no known allergies.   Review of Systems Review of Systems   Physical Exam Triage Vital Signs ED Triage Vitals  Enc Vitals Group     BP 02/15/20 1032 125/77     Pulse Rate 02/15/20 1032 65     Resp 02/15/20 1032 16     Temp 02/15/20 1032 98.7 F (37.1 C)     Temp Source 02/15/20 1032 Oral     SpO2 02/15/20 1032 100 %     Weight --      Height --      Head Circumference --      Peak Flow --      Pain Score 02/15/20 1031 6     Pain Loc --      Pain Edu? --      Excl. in [place]? --    No data found.  Updated Vital Signs BP 125/77 (BP Location: Right Arm)   Pulse 65   Temp 98.7 F  (37.1 C) (Oral)   Resp 16   SpO2 100%   Visual Acuity Right Eye Distance:   Left Eye Distance:   Bilateral Distance:    Right Eye Near:   Left Eye Near:    Bilateral Near:     Physical Exam Vitals and nursing note reviewed.  Constitutional:      General: She is not in acute distress.    Appearance: Normal appearance. She is not ill-appearing, toxic-appearing or diaphoretic.  HENT:     Head: Normocephalic.     Nose: Nose normal.  Eyes:     Conjunctiva/sclera: Conjunctivae normal.  Pulmonary:     Effort: Pulmonary effort is normal.  Abdominal:     General: Abdomen is flat. Bowel sounds are normal.     Palpations: Abdomen is soft.     Tenderness: There is abdominal tenderness in the epigastric area. There is no right CVA tenderness or left CVA tenderness.       Comments: Mildly TTP No guarding or rebound.   Musculoskeletal:        General: Normal range of motion.     Cervical back: Normal range of motion.  Skin:    General: Skin is warm and dry.     Findings: No rash.  Neurological:     Mental Status: She is alert.  Psychiatric:        Mood and Affect: Mood normal.      UC Treatments / Results  Labs (all labs ordered are listed, but only abnormal results are displayed) Labs Reviewed  POCT URINALYSIS DIP (DEVICE) - Abnormal; Notable for the following components:      Result Value   Hgb urine dipstick MODERATE (*)    Protein, ur 30 (*)    Leukocytes,Ua SMALL (*)    All other components within normal limits  URINE CULTURE  POC URINE PREG, ED    EKG   Radiology DG Abd 2 Views  Result Date: 02/15/2020 CLINICAL DATA:  Abdominal pain for 1.5 weeks, past history thyroid cancer EXAM: ABDOMEN - 2 VIEW COMPARISON:  09/24/2006 FINDINGS: Multiple calculi project over the RIGHT abdomen, favor renal calculi. Largest of these measure 15 x 8 mm and 11 x 7 mm. No LEFT-sided calculi identified. Tiny LEFT pelvic phleboliths. Bones demineralized with dextroconvex  thoracolumbar scoliosis. Nonobstructive bowel gas pattern. IMPRESSION: Multiple calcifications project over the RIGHT mid  abdomen, potentially RIGHT renal calculi; these have increased in number and size since the prior study. These could be better localized by CT. Nonobstructive bowel gas pattern. Electronically Signed   By: Lavonia Dana M.D.   On: 02/15/2020 11:51    Procedures Procedures (including critical care time)  Medications Ordered in UC Medications - No data to display  Initial Impression / Assessment and Plan / UC Course  I have reviewed the triage vital signs and the nursing notes.  Pertinent labs & imaging results that were available during my care of the patient were reviewed by me and considered in my medical decision making (see chart for details).     Upper abdominal pain/most likely due to kidney stones that are chronic and constipation. She is also having some GERD type symptoms.  No acute abdomen on exam and no red flags Urine with small leuks and trace blood.  Consistent with previous urine samples and negative cultures Not convinced of pyelonephritis or UTI at this time. X-ray revealed kidney stones that are rather large in size. X-ray also revealed stool burden. Will treat with MiraLAX Patient needs primary care follow-up and urology referral Contacts given for both of these. Recommended if pain or symptoms worsen she will need to go to the ER for CT scan. Also trying some omeprazole for GERD type symptoms Follow up as needed for continued or worsening symptoms    Final Clinical Impressions(s) / UC Diagnoses   Final diagnoses:  Kidney stones  Pain of upper abdomen  Constipation, unspecified constipation type     Discharge Instructions     Your xray showed a few kidney stones This may be the cause of some of your pain and symptoms you have been having over the past few weeks.  You have continued to have blood and bacteria in the urine.  You also  appear to be constipated.  I am giving you medicine for constipation and acid reflux.  You may need to see a specialist about the kidney stones.  I have placed a contact on your discharge instructions.  Call them today.  If our symptoms worsen you need to go to the ER for CT scan.     ED Prescriptions    Medication Sig Dispense Auth. Provider   polyethylene glycol (MIRALAX / GLYCOLAX) 17 g packet Take 17 g by mouth daily. 14 each Milianna Ericsson A, NP   omeprazole (PRILOSEC) 20 MG capsule Take 1 capsule (20 mg total) by mouth daily. 30 capsule Erum Cercone A, NP     PDMP not reviewed this encounter.   Orvan July, NP 02/16/20 [phone]

## 2020-02-15 NOTE — Discharge Instructions (Addendum)
Your xray showed a few kidney stones This may be the cause of some of your pain and symptoms you have been having over the past few weeks.  You have continued to have blood and bacteria in the urine.  You also appear to be constipated.  I am giving you medicine for constipation and acid reflux.  You may need to see a specialist about the kidney stones.  I have placed a contact on your discharge instructions.  Call them today.  If our symptoms worsen you need to go to the ER for CT scan.

## 2020-02-15 NOTE — ED Triage Notes (Signed)
C/o abdominal pain x1 wk. Denies nausea. Patient states "it feels like air is coming in my stomach"

## 2020-02-16 LAB — URINE CULTURE: Culture: 10000 — AB

## 2020-02-22 ENCOUNTER — Ambulatory Visit: Payer: Medicaid Other

## 2020-06-11 ENCOUNTER — Encounter (HOSPITAL_COMMUNITY): Payer: Self-pay

## 2020-06-11 ENCOUNTER — Ambulatory Visit (HOSPITAL_COMMUNITY)
Admission: EM | Admit: 2020-06-11 | Discharge: 2020-06-11 | Disposition: A | Discharge status: Home / Self Care | Payer: Medicaid Other | Attending: Family Medicine | Admitting: Family Medicine

## 2020-06-11 ENCOUNTER — Other Ambulatory Visit: Payer: Self-pay

## 2020-06-11 DIAGNOSIS — K047 Periapical abscess without sinus: Secondary | ICD-10-CM

## 2020-06-11 MED ORDER — LIDOCAINE VISCOUS HCL 2 % MT SOLN: 5.0000 mL | OROMUCOSAL | 0 refills | Status: DC | PRN

## 2020-06-11 MED ORDER — CLINDAMYCIN HCL 150 MG PO CAPS
150.0000 mg | ORAL_CAPSULE | Freq: Four times a day (QID) | ORAL | 0 refills | Status: DC
Start: 2020-06-11 — End: 2020-09-17

## 2020-06-11 NOTE — ED Provider Notes (Signed)
[place]    CSN: [id number] Arrival date & time: 06/11/20  0802      History   Chief Complaint Chief Complaint  Patient presents with  . Dental Pain    HPI Marissa [name] is a 54 y.o. female.   About 2 days of tooth ache and facial swelling right upper jaw. States the area of pain lies under her denture/insert. Taking tylenol without much benefit. No fever, chills, sweats, N/V/D. Does not have a dentist or a primary care provider.       Past Medical History:  Diagnosis Date  . Cancer [facility])    THYROID   CANCER    Patient Active Problem List   Diagnosis Date Noted  . 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 Pericardial effusion 02/18/2013  . Hypothyroidism (acquired) 02/18/2013  . Hypokalemia 02/18/2013  . Pancytopenia ([place]) 02/18/2013  . Cancer [facility])     Past Surgical History:  Procedure Laterality Date  . THYROIDECTOMY  07/23/2012   Procedure: THYROIDECTOMY;  Surgeon: Izora Gala, MD;  Location: [facility] OR;  Service: ENT;  Laterality: Right;  Right thyroid lobectomy possible total   . THYROIDECTOMY  09/15/2012   Procedure: THYROIDECTOMY;  Surgeon: Izora Gala, MD;  Location: [place];  Service: ENT;  Laterality: Left;  COMPLETION OF THYROIDECTOMY    OB History   No obstetric history on file.      Home Medications    Prior to Admission medications   Medication Sig Start Date End Date Taking? Authorizing Provider  acetaminophen (TYLENOL) 500 MG tablet Take 500 mg by mouth every 6 (six) hours as needed.    [provider]  Cetirizine HCl 10 MG CAPS Take 1 capsule (10 mg total) by mouth daily. 06/07/19   Wieters, Hallie C, PA-C  clindamycin (CLEOCIN) 150 MG capsule Take 1 capsule (150 mg total) by mouth every 6 (six) hours. 06/11/20   Volney American, PA-C  gabapentin (NEURONTIN) 300 MG capsule 1 tab po at bedtime 1st day, 1 tablet bid second day, then 1 tablet tid 12/24/16   Melynda Ripple, MD  ibuprofen (ADVIL,MOTRIN) 600 MG tablet Take 1 tablet (600 mg total)  by mouth every 6 (six) hours as needed. 12/24/16   Melynda Ripple, MD  levothyroxine (SYNTHROID, LEVOTHROID) 100 MCG tablet Take 1 tablet (100 mcg total) by mouth daily before breakfast. 02/21/13   Dellinger, Bobby Rumpf, PA-C  lidocaine (LIDODERM) 5 % Place 1 patch onto the skin daily. Remove & Discard patch within 12 hours or as directed by MD 12/24/16   Melynda Ripple, MD  lidocaine (XYLOCAINE) 2 % solution Use as directed 5-20 mLs in the mouth or throat as needed for mouth pain. 06/11/20   Volney American, PA-C  naproxen (NAPROSYN) 500 MG tablet Take 1 tablet (500 mg total) by mouth 2 (two) times daily. 01/30/20   Faustino Congress, NP  omeprazole (PRILOSEC) 20 MG capsule Take 1 capsule (20 mg total) by mouth daily. 02/15/20   Bast, Tressia Miners A, NP  polyethylene glycol (MIRALAX / GLYCOLAX) 17 g packet Take 17 g by mouth daily. 02/15/20   Loura Halt A, NP  traMADol (ULTRAM) 50 MG tablet Take 1 tablet (50 mg total) by mouth every 6 (six) hours as needed. 01/30/20   Faustino Congress, NP    Family History Family History  Family history unknown: Yes    Social History Social History   Tobacco Use  . Smoking status: Never Smoker  . Smokeless tobacco: Never Used  Substance Use Topics  .  Alcohol use: No  . Drug use: No     Allergies   Patient has no known allergies.   Review of Systems Review of Systems PER HPI   Physical Exam Triage Vital Signs ED Triage Vitals  Enc Vitals Group     BP 06/11/20 0827 110/68     Pulse Rate 06/11/20 0827 75     Resp 06/11/20 0827 16     Temp 06/11/20 0827 98.5 F (36.9 C)     Temp Source 06/11/20 0827 Oral     SpO2 06/11/20 0827 98 %     Weight 06/11/20 0828 134 lb (60.8 kg)     Height 06/11/20 0828 5\' 1"  (1.549 m)     Head Circumference --      Peak Flow --      Pain Score 06/11/20 0828 4     Pain Loc --      Pain Edu? --      Excl. in [place]? --    No data found.  Updated Vital Signs BP 110/68   Pulse 75   Temp 98.5 F (36.9  C) (Oral)   Resp 16   Ht 5\' 1"  (1.549 m)   Wt 134 lb (60.8 kg)   SpO2 98%   BMI 25.32 kg/m   Visual Acuity Right Eye Distance:   Left Eye Distance:   Bilateral Distance:    Right Eye Near:   Left Eye Near:    Bilateral Near:     Physical Exam Vitals and nursing note reviewed.  Constitutional:      Appearance: Normal appearance. She is not ill-appearing.  HENT:     Head: Atraumatic.     Right Ear: Tympanic membrane normal.     Left Ear: Tympanic membrane normal.     Nose: Nose normal. No rhinorrhea.     Mouth/Throat:     Mouth: Mucous membranes are moist.     Pharynx: Posterior oropharyngeal erythema (and bloody erosion of top right jaw near where canine tooth should be) present.     Comments: Significant right cheek/facial swelling  Eyes:     Extraocular Movements: Extraocular movements intact.     Conjunctiva/sclera: Conjunctivae normal.  Cardiovascular:     Rate and Rhythm: Normal rate and regular rhythm.     Heart sounds: Normal heart sounds.  Pulmonary:     Effort: Pulmonary effort is normal.     Breath sounds: Normal breath sounds.  Abdominal:     General: Bowel sounds are normal.     Palpations: Abdomen is soft.     Tenderness: There is no abdominal tenderness. There is no guarding.  Musculoskeletal:        General: Normal range of motion.     Cervical back: Normal range of motion and neck supple.  Lymphadenopathy:     Cervical: No cervical adenopathy.  Skin:    General: Skin is warm and dry.  Neurological:     Mental Status: She is alert and oriented to person, place, and time.  Psychiatric:        Mood and Affect: Mood normal.        Thought Content: Thought content normal.        Judgment: Judgment normal.      UC Treatments / Results  Labs (all labs ordered are listed, but only abnormal results are displayed) Labs Reviewed - No data to display  EKG   Radiology No results found.  Procedures Procedures (including critical care  time)  Medications Ordered in UC Medications - No data to display  Initial Impression / Assessment and Plan / UC Course  I have reviewed the triage vital signs and the nursing notes.  Pertinent labs & imaging results that were available during my care of the patient were reviewed by me and considered in my medical decision making (see chart for details).     Dental infection and erosion. Tx with clindamycin, salt water rinses, and viscous lidocaine prn for pain relief. OTC pain relievers reviewed for additional pain control. F/u ASAP with dentist and find primary care provider for ongoing care needs Work note given for next 2 days.  Final Clinical Impressions(s) / UC Diagnoses   Final diagnoses:  Dental abscess     Discharge Instructions     Gargle with salt water several times daily to keep the mouth clean. Take full course of antibiotics, use the numbing solution as needed as well as Tylenol and ibuprofen for pain as needed. Follow up with a dentist as soon as possible for ongoing care.     ED Prescriptions    Medication Sig Dispense Auth. Provider   clindamycin (CLEOCIN) 150 MG capsule Take 1 capsule (150 mg total) by mouth every 6 (six) hours. 28 capsule Volney American, [place]   lidocaine (XYLOCAINE) 2 % solution Use as directed 5-20 mLs in the mouth or throat as needed for mouth pain. 100 mL Volney American, [place]     PDMP not reviewed this encounter.   Merrie Roof [place], [place] 06/11/20 [phone]

## 2020-06-11 NOTE — Discharge Instructions (Addendum)
Gargle with salt water several times daily to keep the mouth clean. Take full course of antibiotics, use the numbing solution as needed as well as Tylenol and ibuprofen for pain as needed. Follow up with a dentist as soon as possible for ongoing care.

## 2020-06-11 NOTE — ED Triage Notes (Signed)
Pt c/o 4/10 pain in right upper mouthx2 days. Pt has 2+ swelling of right side of face. Pt able to swallow. Pt states it's painful to chew.

## 2020-07-21 ENCOUNTER — Ambulatory Visit (HOSPITAL_COMMUNITY)
Admission: EM | Admit: 2020-07-21 | Discharge: 2020-07-21 | Disposition: A | Discharge status: Home / Self Care | Payer: Medicaid Other | Attending: Family Medicine | Admitting: Family Medicine

## 2020-07-21 ENCOUNTER — Other Ambulatory Visit: Payer: Self-pay

## 2020-07-21 ENCOUNTER — Encounter (HOSPITAL_COMMUNITY): Payer: Self-pay | Admitting: *Deleted

## 2020-07-21 DIAGNOSIS — K5900 Constipation, unspecified: Secondary | ICD-10-CM

## 2020-07-21 DIAGNOSIS — K602 Anal fissure, unspecified: Secondary | ICD-10-CM

## 2020-07-21 MED ORDER — POLYETHYLENE GLYCOL 3350 17 G PO PACK: 17.0000 g | PACK | Freq: Every day | ORAL | 0 refills | Status: DC

## 2020-07-21 MED ORDER — LIDOCAINE (ANORECTAL) 5 % EX GEL
CUTANEOUS | 0 refills | Status: DC
Start: 2020-07-21 — End: 2021-06-26

## 2020-07-21 NOTE — Discharge Instructions (Addendum)
Recommend following up with primary care provider as you are overdue for a colonoscopy. I have prescribed lidocaine rectal gel for rectal pain. Start Miralax packs 1 pack per day for 2-3 days or until stool is soft.  Recommend drinking at minimum 6 to 8 glasses of water on a daily basis to improve stool consistency.

## 2020-07-21 NOTE — ED Triage Notes (Signed)
Pt reports pain to rectum when having BM last night and worse this AM. No blood was seen on tissue or in water after BM.

## 2020-07-21 NOTE — ED Provider Notes (Signed)
[place]    CSN: [id number] Arrival date & time: 07/21/20  1606      History   Chief Complaint Chief Complaint  Patient presents with  . Hemorrhoids    HPI Marissa [name] is a 54 y.o. female.   Daughter present during encounter and is assisting with interpretation. HPI  Patient presents today with 4-day history of gradually hardening of stool.  Patient over the last few days has had pain with stools.  No prior history of recurrent constipation.  She denies noticing any changes in the color of her stools. Admits to poor water intake.  Endorses stools have been stronger recently than normal and this is when she has noticed the pain with defecation.  She denies seeing any external bleeding with wiping.  Denies any abdominal pain.  She had a softer bowel movement today and noticed that the pain had resolved.  Past Medical History:  Diagnosis Date  . Cancer [facility])    THYROID   CANCER    Patient Active Problem List   Diagnosis Date Noted  . 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 Pericardial effusion 02/18/2013  . Hypothyroidism (acquired) 02/18/2013  . Hypokalemia 02/18/2013  . Pancytopenia ([place]) 02/18/2013  . Cancer [facility])     Past Surgical History:  Procedure Laterality Date  . THYROIDECTOMY  07/23/2012   Procedure: THYROIDECTOMY;  Surgeon: Izora Gala, MD;  Location: [facility] OR;  Service: ENT;  Laterality: Right;  Right thyroid lobectomy possible total   . THYROIDECTOMY  09/15/2012   Procedure: THYROIDECTOMY;  Surgeon: Izora Gala, MD;  Location: [place];  Service: ENT;  Laterality: Left;  COMPLETION OF THYROIDECTOMY    OB History   No obstetric history on file.      Home Medications    Prior to Admission medications   Medication Sig Start Date End Date Taking? Authorizing Provider  levothyroxine (SYNTHROID, LEVOTHROID) 100 MCG tablet Take 1 tablet (100 mcg total) by mouth daily before breakfast. 02/21/13  Yes Dellinger, Bobby Rumpf, PA-C  acetaminophen (TYLENOL) 500 MG tablet  Take 500 mg by mouth every 6 (six) hours as needed.    [provider]  Cetirizine HCl 10 MG CAPS Take 1 capsule (10 mg total) by mouth daily. 06/07/19   Wieters, Hallie C, PA-C  clindamycin (CLEOCIN) 150 MG capsule Take 1 capsule (150 mg total) by mouth every 6 (six) hours. 06/11/20   Volney American, PA-C  gabapentin (NEURONTIN) 300 MG capsule 1 tab po at bedtime 1st day, 1 tablet bid second day, then 1 tablet tid 12/24/16   Melynda Ripple, MD  ibuprofen (ADVIL,MOTRIN) 600 MG tablet Take 1 tablet (600 mg total) by mouth every 6 (six) hours as needed. 12/24/16   Melynda Ripple, MD  lidocaine (LIDODERM) 5 % Place 1 patch onto the skin daily. Remove & Discard patch within 12 hours or as directed by MD 12/24/16   Melynda Ripple, MD  lidocaine (XYLOCAINE) 2 % solution Use as directed 5-20 mLs in the mouth or throat as needed for mouth pain. 06/11/20   Volney American, PA-C  naproxen (NAPROSYN) 500 MG tablet Take 1 tablet (500 mg total) by mouth 2 (two) times daily. 01/30/20   Faustino Congress, NP  omeprazole (PRILOSEC) 20 MG capsule Take 1 capsule (20 mg total) by mouth daily. 02/15/20   Bast, Tressia Miners A, NP  polyethylene glycol (MIRALAX / GLYCOLAX) 17 g packet Take 17 g by mouth daily. 02/15/20   Loura Halt A, NP  traMADol (ULTRAM) 50 MG tablet  Take 1 tablet (50 mg total) by mouth every 6 (six) hours as needed. 01/30/20   Faustino Congress, NP    Family History Family History  Family history unknown: Yes    Social History Social History   Tobacco Use  . Smoking status: Never Smoker  . Smokeless tobacco: Never Used  Substance Use Topics  . Alcohol use: No  . Drug use: No     Allergies   Patient has no known allergies.   Review of Systems Review of Systems Pertinent negatives listed in HPI  Physical Exam Triage Vital Signs ED Triage Vitals  Enc Vitals [name]     BP 07/21/20 1631 110/73     Pulse Rate 07/21/20 1631 71     Resp 07/21/20 1631 15     Temp  07/21/20 1631 98.4 F (36.9 C)     Temp Source 07/21/20 1631 Oral     SpO2 07/21/20 1631 100 %     Weight 07/21/20 1626 138 lb (62.6 kg)     Height 07/21/20 1626 5\' 1"  (1.549 m)     Head Circumference --      Peak Flow --      Pain Score 07/21/20 1625 0     Pain Loc --      Pain Edu? --      Excl. in [place]? --    No data found.  Updated Vital Signs BP 110/73 (BP Location: Right Arm)   Pulse 71   Temp 98.4 F (36.9 C) (Oral)   Resp 15   Ht 5\' 1"  (1.549 m)   Wt 138 lb (62.6 kg)   SpO2 100%   BMI 26.07 kg/m   Visual Acuity Right Eye Distance:   Left Eye Distance:   Bilateral Distance:    Right Eye Near:   Left Eye Near:    Bilateral Near:     Physical Exam Constitutional:      Appearance: Normal appearance.  Cardiovascular:     Rate and Rhythm: Normal rate and regular rhythm.  Pulmonary:     Effort: Pulmonary effort is normal.     Breath sounds: Normal breath sounds.  Genitourinary:    Rectum: Anal fissure present. No external hemorrhoid.     Comments: Daughter present in room during rectal exam.  Neurological:     General: No focal deficit present.     Mental Status: She is alert and oriented to person, place, and time.  Psychiatric:        Mood and Affect: Mood normal.        Behavior: Behavior normal.        Thought Content: Thought content normal.        Judgment: Judgment normal.    UC Treatments / Results  Labs (all labs ordered are listed, but only abnormal results are displayed) Labs Reviewed - No data to display  EKG   Radiology No results found.  Procedures Procedures (including critical care time)  Medications Ordered in UC Medications - No data to display  Initial Impression / Assessment and Plan / UC Course  I have reviewed the triage vital signs and the nursing notes.  Pertinent labs & imaging results that were available during my care of the patient were reviewed by me and considered in my medical decision making (see chart for  details).    Patient presents today with rectal pain on exam anal fissure identified at the 6 o'clock position of the rectum.  Prescribing lidocaine gel to apply  directly as needed for pain relief.  Encourage patient to drink water and also added MiraLAX to soften stools over the course of the next few days.  Patient is over the age of 17 and has never had a colonoscopy spoke with daughter to advise to discuss with patient's primary care the need for patient to have a colon cancer screening.  Daughter and patient verbalized understanding and agreement with plan. Final Clinical Impressions(s) / UC Diagnoses   Final diagnoses:  Constipation, unspecified constipation type  Rectal fissure     Discharge Instructions     Recommend following up with primary care provider as you are overdue for a colonoscopy. I have prescribed lidocaine rectal gel for rectal pain. Start Miralax packs 1 pack per day for 2-3 days or until stool is soft.  Recommend drinking at minimum 6 to 8 glasses of water on a daily basis to improve stool consistency.    ED Prescriptions    Medication Sig Dispense Auth. Provider   polyethylene glycol (MIRALAX) 17 g packet Take 17 g by mouth daily. 14 each Scot Jun, FNP   Lidocaine, Anorectal, 5 % GEL Apply a pea-sized amount to the affected area 3 times daily. 30 g Scot Jun, FNP     PDMP not reviewed this encounter.   Scot Jun, FNP 07/24/20 1005

## 2020-09-17 ENCOUNTER — Other Ambulatory Visit: Payer: Self-pay

## 2020-09-17 ENCOUNTER — Encounter (HOSPITAL_COMMUNITY): Payer: Self-pay | Admitting: *Deleted

## 2020-09-17 ENCOUNTER — Ambulatory Visit (HOSPITAL_COMMUNITY)
Admission: EM | Admit: 2020-09-17 | Discharge: 2020-09-17 | Disposition: A | Discharge status: Home / Self Care | Payer: Medicaid Other | Attending: Student | Admitting: Student

## 2020-09-17 DIAGNOSIS — K5900 Constipation, unspecified: Secondary | ICD-10-CM | POA: Insufficient documentation

## 2020-09-17 DIAGNOSIS — Z87442 Personal history of urinary calculi: Secondary | ICD-10-CM | POA: Diagnosis present

## 2020-09-17 LAB — POCT URINALYSIS DIPSTICK, ED / UC
Bilirubin Urine: NEGATIVE
Glucose, UA: NEGATIVE mg/dL
Ketones, ur: NEGATIVE mg/dL
Nitrite: NEGATIVE
Protein, ur: NEGATIVE mg/dL
Specific Gravity, Urine: 1.025 (ref 1.005–1.030)
Urobilinogen, UA: 0.2 mg/dL (ref 0.0–1.0)
pH: 7.5 (ref 5.0–8.0)

## 2020-09-17 MED ORDER — DOCUSATE SODIUM 250 MG PO CAPS
250.0000 mg | ORAL_CAPSULE | Freq: Every day | ORAL | 0 refills | Status: DC
Start: 2020-09-17 — End: 2021-07-05

## 2020-09-17 MED ORDER — POLYETHYLENE GLYCOL 3350 17 G PO PACK: 17.0000 g | PACK | Freq: Every day | ORAL | 0 refills | Status: AC

## 2020-09-17 NOTE — ED Triage Notes (Signed)
Per Pt HA started on Friday . Pt also reports something feels stuck in her throat. Pt also has chills. Pt is requesting a urine test for infection.

## 2020-09-17 NOTE — Discharge Instructions (Addendum)
For constipation, continue Miralax and start Colace (stool softener). Make sure to drink plenty of water and eat plenty of fruits/vegetables. Please schedule a visit with a Primary Care Provider, who can further evaluate your GI symptoms.

## 2020-09-17 NOTE — ED Provider Notes (Signed)
[place]    CSN: [id number] Arrival date & time: 09/17/20  1515      History   Chief Complaint Chief Complaint  Patient presents with  . Headache  . Chills    HPI Marissa [name] is a 54 y.o. female presenting for follow-up of low back pain, abdominal pain. She denies symptoms today. She has had a mild headache lately, as well as constipation for the last 4 days. She thinks maybe she has a stomach bug or ate something bad. She is having bowel movements and passing gas normally, though she has been requiring laxative over the last few days. Denies hematuria, dysuria, frequency, urgency, back pain, n/v/d/abd pain, fevers/chills, abdnormal vaginal discharge. Denies fevers/chills, n/v/d, headache, sinus pain, chest pain, shortness of breath. Patient and her daughter both stated that her abdomen feels "cold", but I believe they mean she is constipated. She does not have fevers/chills or abdominal pain. Adamantly denies n/v/d, and states that she is having bowel movements using her laxative. Positive history of kidney stones. Denies blood on toilet paper when wiping.   HPI  Past Medical History:  Diagnosis Date  . Cancer [facility])    THYROID   CANCER    Patient Active Problem List   Diagnosis Date Noted  . 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 Pericardial effusion 02/18/2013  . Hypothyroidism (acquired) 02/18/2013  . Hypokalemia 02/18/2013  . Pancytopenia ([place]) 02/18/2013  . Cancer [facility])     Past Surgical History:  Procedure Laterality Date  . THYROIDECTOMY  07/23/2012   Procedure: THYROIDECTOMY;  Surgeon: Izora Gala, MD;  Location: [facility] OR;  Service: ENT;  Laterality: Right;  Right thyroid lobectomy possible total   . THYROIDECTOMY  09/15/2012   Procedure: THYROIDECTOMY;  Surgeon: Izora Gala, MD;  Location: [place];  Service: ENT;  Laterality: Left;  COMPLETION OF THYROIDECTOMY    OB History   No obstetric history on file.      Home Medications    Prior to Admission medications    Medication Sig Start Date End Date Taking? Authorizing Provider  levothyroxine (SYNTHROID, LEVOTHROID) 100 MCG tablet Take 1 tablet (100 mcg total) by mouth daily before breakfast. 02/21/13  Yes Dellinger, Haynes Dage L, PA-C  lidocaine (LIDODERM) 5 % Place 1 patch onto the skin daily. Remove & Discard patch within 12 hours or as directed by MD 12/24/16  Yes Melynda Ripple, MD  docusate sodium (COLACE) 250 MG capsule Take 1 capsule (250 mg total) by mouth daily. 09/17/20   Hazel Sams, PA-C  Lidocaine, Anorectal, 5 % GEL Apply a pea-sized amount to the affected area 3 times daily. 07/21/20   Scot Jun, FNP  polyethylene glycol (MIRALAX / GLYCOLAX) 17 g packet Take 17 g by mouth daily for 1 day. 09/17/20 09/18/20  Hazel Sams, PA-C  Cetirizine HCl 10 MG CAPS Take 1 capsule (10 mg total) by mouth daily. 06/07/19 09/17/20  Wieters, Hallie C, PA-C  gabapentin (NEURONTIN) 300 MG capsule 1 tab po at bedtime 1st day, 1 tablet bid second day, then 1 tablet tid 12/24/16 09/17/20  Melynda Ripple, MD  omeprazole (PRILOSEC) 20 MG capsule Take 1 capsule (20 mg total) by mouth daily. 02/15/20 09/17/20  Orvan July, NP    Family History Family History  Family history unknown: Yes    Social History Social History   Tobacco Use  . Smoking status: Never Smoker  . Smokeless tobacco: Never Used  Substance Use Topics  . Alcohol use: No  . Drug  use: No     Allergies   Patient has no known allergies.   Review of Systems Review of Systems  Constitutional: Negative for appetite change, chills and fever.  HENT: Negative for congestion, ear pain, sinus pain and sore throat.   Respiratory: Negative for cough and shortness of breath.   Cardiovascular: Negative for chest pain.  Gastrointestinal: Positive for constipation. Negative for diarrhea, nausea and vomiting.  Genitourinary: Negative for flank pain, hematuria, pelvic pain and urgency.  Musculoskeletal: Negative for back pain.  All  other systems reviewed and are negative.    Physical Exam Triage Vital Signs ED Triage Vitals  Enc Vitals Group     BP 09/17/20 1736 125/88     Pulse Rate 09/17/20 1736 70     Resp 09/17/20 1736 16     Temp 09/17/20 1736 97.6 F (36.4 C)     Temp Source 09/17/20 1736 Oral     SpO2 09/17/20 1736 100 %     Weight 09/17/20 1737 130 lb (59 kg)     Height --      Head Circumference --      Peak Flow --      Pain Score 09/17/20 1737 0     Pain Loc --      Pain Edu? --      Excl. in [place]? --    No data found.  Updated Vital Signs BP 125/88 (BP Location: Left Arm)   Pulse 70   Temp 97.6 F (36.4 C) (Oral)   Resp 16   Wt 130 lb (59 kg)   SpO2 100%   BMI 24.56 kg/m   Visual Acuity Right Eye Distance:   Left Eye Distance:   Bilateral Distance:    Right Eye Near:   Left Eye Near:    Bilateral Near:     Physical Exam Vitals reviewed.  Constitutional:      General: She is not in acute distress.    Appearance: Normal appearance. She is not ill-appearing.  HENT:     Head: Normocephalic and atraumatic.  Cardiovascular:     Rate and Rhythm: Normal rate and regular rhythm.     Heart sounds: Normal heart sounds.  Pulmonary:     Effort: Pulmonary effort is normal.     Breath sounds: Normal breath sounds. No wheezing, rhonchi or rales.  Abdominal:     General: Bowel sounds are normal. There is no distension.     Palpations: Abdomen is soft. There is no mass.     Tenderness: There is abdominal tenderness in the suprapubic area. There is no right CVA tenderness, left CVA tenderness, guarding or rebound.     Comments: Bowel sounds positive in all 4 quadrants.  Mild suprapubic tenderenss to deep palpation, otherwise No tenderness to palpation. Negative Murphy Sign, Rovsing's sign, McBurney point tenderness.   Neurological:     General: No focal deficit present.     Mental Status: She is alert and oriented to person, place, and time.  Psychiatric:        Mood and Affect: Mood  normal.        Behavior: Behavior normal.      UC Treatments / Results  Labs (all labs ordered are listed, but only abnormal results are displayed) Labs Reviewed  POCT URINALYSIS DIPSTICK, ED / UC    EKG   Radiology No results found.  Procedures Procedures (including critical care time)  Medications Ordered in UC Medications - No data to display  Initial Impression / Assessment and Plan / UC Course  I have reviewed the triage vital signs and the nursing notes.  Pertinent labs & imaging results that were available during my care of the patient were reviewed by me and considered in my medical decision making (see chart for details).     UA today showing small blood and trace leuk. Pt with a history of kidney stones. Culture sent. Again recommended following-up with PCP who can coordinate colonoscopy, GI referral, urology referral, etc. She will continue to use Miralax, which she has a home. Also sent script for Stool softener today. Stressed importance of healthy diet and adequate water consumption: 6-8 glasses daily.  Return precautions- fevers, worsening back pain, worsening abd pain, etc. Head to ER.     Final Clinical Impressions(s) / UC Diagnoses   Final diagnoses:  Constipation, unspecified constipation type  History of kidney stones   Discharge Instructions   None    ED Prescriptions    Medication Sig Dispense Auth. Provider   polyethylene glycol (MIRALAX / GLYCOLAX) 17 g packet Take 17 g by mouth daily for 1 day. 14 each Hazel Sams, PA-C   docusate sodium (COLACE) 250 MG capsule Take 1 capsule (250 mg total) by mouth daily. 10 capsule Hazel Sams, PA-C     PDMP not reviewed this encounter.   Hazel Sams, PA-C 09/17/20 1936

## 2020-09-18 LAB — URINE CULTURE

## 2021-05-15 ENCOUNTER — Ambulatory Visit (HOSPITAL_COMMUNITY)
Admission: EM | Admit: 2021-05-15 | Discharge: 2021-05-15 | Disposition: A | Discharge status: Home / Self Care | Payer: Medicaid Other | Attending: Medical Oncology | Admitting: Medical Oncology

## 2021-05-15 ENCOUNTER — Encounter (HOSPITAL_COMMUNITY): Payer: Self-pay

## 2021-05-15 ENCOUNTER — Other Ambulatory Visit: Payer: Self-pay

## 2021-05-15 DIAGNOSIS — G5603 Carpal tunnel syndrome, bilateral upper limbs: Secondary | ICD-10-CM | POA: Diagnosis not present

## 2021-05-15 MED ORDER — MELOXICAM 7.5 MG PO TABS
7.5000 mg | ORAL_TABLET | Freq: Every day | ORAL | 0 refills | Status: DC
Start: 2021-05-15 — End: 2021-07-26

## 2021-05-15 NOTE — ED Provider Notes (Signed)
[place]    CSN: [id number] Arrival date & time: 05/15/21  [id number]      History   Chief Complaint Chief Complaint  Patient presents with   Hand Pain    HPI Marissa [name] is a 55 y.o. female.   HPI  Hand Pain: Patient reports with her daughter who provides translation services for patient per her request.  Patient states that she recently switched positions at work where she uses her hands frequently.  She now develops numbness and tingling in her hands at work and occasionally in the mornings as well.  She also feels like there is a lack of strength of both hands.  She has tried over-the-counter icy hot patches without much success.  She denies any other neuro weakness or symptoms of her feet.  She reports that she is up-to-date on her follow-up with her PCP regarding blood work.   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   Pericardial effusion 02/18/2013   Hypothyroidism (acquired) 02/18/2013   Hypokalemia 02/18/2013   Pancytopenia ([place]) 02/18/2013   Cancer ([place])     Past Surgical History:  Procedure Laterality Date   THYROIDECTOMY  07/23/2012   Procedure: THYROIDECTOMY;  Surgeon: Izora Gala, MD;  [facility]: [facility] OR;  Service: ENT;  Laterality: Right;  Right thyroid lobectomy possible total    THYROIDECTOMY  09/15/2012   Procedure: THYROIDECTOMY;  Surgeon: Izora Gala, MD;  Location: [place];  Service: ENT;  Laterality: Left;  COMPLETION OF THYROIDECTOMY    OB History   No obstetric history on file.      Home Medications    Prior to Admission medications   Medication Sig Start Date End Date Taking? Authorizing Provider  docusate sodium (COLACE) 250 MG capsule Take 1 capsule (250 mg total) by mouth daily. 09/17/20   Hazel Sams, PA-C  levothyroxine (SYNTHROID, LEVOTHROID) 100 MCG tablet Take 1 tablet (100 mcg total) by mouth daily before  breakfast. 02/21/13   Dellinger, Bobby Rumpf, PA-C  lidocaine (LIDODERM) 5 % Place 1 patch onto the skin daily. Remove & Discard patch within 12 hours or as directed by MD 12/24/16   Melynda Ripple, MD  Lidocaine, Anorectal, 5 % GEL Apply a pea-sized amount to the affected area 3 times daily. 07/21/20   Scot Jun, FNP  Cetirizine HCl 10 MG CAPS Take 1 capsule (10 mg total) by mouth daily. 06/07/19 09/17/20  Wieters, Hallie C, PA-C  gabapentin (NEURONTIN) 300 MG capsule 1 tab po at bedtime 1st day, 1 tablet bid second day, then 1 tablet tid 12/24/16 09/17/20  Melynda Ripple, MD  omeprazole (PRILOSEC) 20 MG capsule Take 1 capsule (20 mg total) by mouth daily. 02/15/20 09/17/20  Orvan July, NP    Family History Family History  Family history unknown: Yes    Social History Social History   Tobacco Use   Smoking status: Never   Smokeless tobacco: Never  Substance Use Topics   Alcohol use: No   Drug use: No     Allergies   Patient has no known allergies.   Review of Systems Review of Systems  As stated above in HPI Physical Exam Triage Vital Signs ED Triage Vitals  Enc Vitals Group     BP 05/15/21 0852 131/77     Pulse Rate 05/15/21 0852 68  Resp 05/15/21 0852 17     Temp 05/15/21 0852 98.3 F (36.8 C)     Temp Source 05/15/21 0852 Oral     SpO2 05/15/21 0852 97 %     Weight --      Height --      Head Circumference --      Peak Flow --      Pain Score 05/15/21 0850 0     Pain Loc --      Pain Edu? --      Excl. in [place]? --    No data found.  Updated Vital Signs BP 131/77 (BP Location: Right Arm)   Pulse 68   Temp 98.3 F (36.8 C) (Oral)   Resp 17   SpO2 97%   Physical Exam Vitals and nursing note reviewed.  Constitutional:      General: She is not in acute distress.    Appearance: Normal appearance. She is not ill-appearing, toxic-appearing or diaphoretic.  HENT:     Head: Normocephalic and atraumatic.  Eyes:     Comments: Conjunctiva  without pallor  Cardiovascular:     Rate and Rhythm: Normal rate and regular rhythm.     Pulses: Normal pulses.  Musculoskeletal:        General: No swelling or tenderness. Normal range of motion.     Cervical back: Neck supple.     Comments: Positive Tinel and Phalen signs bilaterally. Bilateral muscle wasting of the thumbs  Skin:    General: Skin is warm.     Capillary Refill: Capillary refill takes less than 2 seconds.     Coloration: Skin is not jaundiced or pale.  Neurological:     General: No focal deficit present.     Mental Status: She is alert and oriented to person, place, and time.     Sensory: No sensory deficit.     Motor: No weakness.     Coordination: Coordination normal.  Psychiatric:        Mood and Affect: Mood normal.        Behavior: Behavior normal.     UC Treatments / Results  Labs (all labs ordered are listed, but only abnormal results are displayed) Labs Reviewed - No data to display  EKG   Radiology No results found.  Procedures Procedures (including critical care time)  Medications Ordered in UC Medications - No data to display  Initial Impression / Assessment and Plan / UC Course  I have reviewed the triage vital signs and the nursing notes.  Pertinent labs & imaging results that were available during my care of the patient were reviewed by me and considered in my medical decision making (see chart for details).     New.  Discussed carpal tunnel with patient and her daughter.  We discussed carpal tunnel braces along with an NSAID to help with her symptoms.  We also discussed exercises.  If symptoms fail to resolve I would recommend orthopedic follow-up.  She also may need to talk to her work about modifying her tasks to offer relief of her symptoms. Final Clinical Impressions(s) / UC Diagnoses   Final diagnoses:  None   Discharge Instructions   None    ED Prescriptions   None    PDMP not reviewed this encounter.   Hughie Closs, [place] 05/15/21 [phone]

## 2021-05-15 NOTE — ED Triage Notes (Signed)
Pt presents with tingling in hands. 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

## 2021-06-26 ENCOUNTER — Other Ambulatory Visit: Payer: Self-pay

## 2021-06-26 ENCOUNTER — Encounter: Payer: Self-pay | Admitting: Nurse Practitioner

## 2021-06-26 ENCOUNTER — Ambulatory Visit: Payer: Medicaid Other | Admitting: Nurse Practitioner

## 2021-06-26 VITALS — BP 125/62 | HR 83 | Temp 97.5°F | Ht 61.0 in | Wt 131.0 lb

## 2021-06-26 DIAGNOSIS — Z131 Encounter for screening for diabetes mellitus: Secondary | ICD-10-CM

## 2021-06-26 DIAGNOSIS — R82998 Other abnormal findings in urine: Secondary | ICD-10-CM | POA: Diagnosis not present

## 2021-06-26 DIAGNOSIS — R1084 Generalized abdominal pain: Secondary | ICD-10-CM

## 2021-06-26 DIAGNOSIS — E890 Postprocedural hypothyroidism: Secondary | ICD-10-CM

## 2021-06-26 DIAGNOSIS — Z0000 Encounter for general adult medical examination without abnormal findings: Secondary | ICD-10-CM

## 2021-06-26 DIAGNOSIS — Z85850 Personal history of malignant neoplasm of thyroid: Secondary | ICD-10-CM

## 2021-06-26 DIAGNOSIS — R197 Diarrhea, unspecified: Secondary | ICD-10-CM

## 2021-06-26 LAB — POCT URINALYSIS DIP (CLINITEK)
Bilirubin, UA: NEGATIVE
Glucose, UA: NEGATIVE mg/dL
Ketones, POC UA: NEGATIVE mg/dL
Nitrite, UA: NEGATIVE
POC PROTEIN,UA: 100 — AB
Spec Grav, UA: >=1.03 — AB (ref 1.010–1.025)
Urobilinogen, UA: 0.2 E.U./dL
pH, UA: 6 (ref 5.0–8.0)

## 2021-06-26 LAB — POCT GLYCOSYLATED HEMOGLOBIN (HGB A1C)
HbA1c POC (<> result, manual entry): 5.9 % (ref 4.0–5.6)
HbA1c, POC (controlled diabetic range): 5.9 % (ref 0.0–7.0)
HbA1c, POC (prediabetic range): 5.9 % (ref 5.7–6.4)
Hemoglobin A1C: 5.9 % — AB (ref 4.0–5.6)

## 2021-06-26 LAB — GLUCOSE, POCT (MANUAL RESULT ENTRY): POC Glucose: 108 mg/dl — AB (ref 70–99)

## 2021-06-26 MED ORDER — DICYCLOMINE HCL 10 MG PO CAPS: 10.0000 mg | ORAL_CAPSULE | Freq: Three times a day (TID) | ORAL | 0 refills | Status: DC | PRN

## 2021-06-26 NOTE — Patient Instructions (Signed)
You were seen today in the [facility] to establish care and abdominal pain. Labs were collected, results will be available via MyChart or, if abnormal, you will be contacted by clinic staff. You were prescribed medications, please take as directed. Please follow up in 1 mth for reevaluation.

## 2021-06-26 NOTE — Progress Notes (Signed)
[address], [place]  [address] Phone:  [phone]   Fax:  [phone] Subjective:   Patient ID: Marissa [name], female    DOB: [birth year], 55 y.o.   MRN: [id number]  Chief Complaint  Patient presents with   Establish Care    Monday night started having abdominal pain and diarrhea.    HPI Marissa [name] 55 y.o. female with history of thyroid cancer presents to the [facility] to establish care and for abdominal pain.  Abdominal Pain: Patient complains of abdominal pain. The pain is described as indescribable, and is 4/10 in intensity. Pain is located in the diffusely without radiation. Onset was 2 days ago. Symptoms have been unchanged since. Aggravating factors: eating.  Alleviating factors: acetaminophen and bowel movements. Associated symptoms: diarrhea. The patient denies arthralgias, belching, dysuria, frequency, headache, and nausea. Endorses some fever and generalized body aches. Fever has been subjective. Denies any recent trauma or injury. Denies any history of abdominal surgery.Denies any recent antibiotics.   Currently compliant with all medications. Denies any chest pain, shortness of breath, HA or dizziness. Patient denies having similar symptoms in the past.  Past Medical History:  Diagnosis Date   Cancer [facility])    THYROID   CANCER    Past Surgical History:  Procedure Laterality Date   THYROIDECTOMY  07/23/2012   Procedure: THYROIDECTOMY;  Surgeon: Izora Gala, MD;  Location: [place];  Service: ENT;  Laterality: Right;  Right thyroid lobectomy possible total    THYROIDECTOMY  09/15/2012   Procedure: THYROIDECTOMY;  Surgeon: Izora Gala, MD;  Location: [facility] OR;  Service: ENT;  Laterality: Left;  COMPLETION OF THYROIDECTOMY    Family History  Family history unknown: Yes    Social History   Socioeconomic History   Marital status: Married    Spouse name: Not on file   Number of children: Not on file   Years of education: Not on file    Highest education level: Not on file  Occupational History   Not on file  Tobacco Use   Smoking status: Never   Smokeless tobacco: Never  Substance and Sexual Activity   Alcohol use: No   Drug use: No   Sexual activity: Yes  Other Topics Concern   Not on file  Social History Narrative   Not on file   Social Determinants of Health   Financial Resource Strain: Not on file  Food Insecurity: Not on file  Transportation Needs: Not on file  Physical Activity: Not on file  Stress: Not on file  Social Connections: Not on file  Intimate Partner Violence: Not on file    Outpatient Medications Prior to Visit  Medication Sig Dispense Refill   levothyroxine (SYNTHROID, LEVOTHROID) 100 MCG tablet Take 1 tablet (100 mcg total) by mouth daily before breakfast. 30 tablet 6   docusate sodium (COLACE) 250 MG capsule Take 1 capsule (250 mg total) by mouth daily. (Patient not taking: Reported on 06/26/2021) 10 capsule 0   meloxicam (MOBIC) 7.5 MG tablet Take 1 tablet (7.5 mg total) by mouth daily. (Patient not taking: Reported on 06/26/2021) 30 tablet 0   lidocaine (LIDODERM) 5 % Place 1 patch onto the skin daily. Remove & Discard patch within 12 hours or as directed by MD 30 patch 0   Lidocaine, Anorectal, 5 % GEL Apply a pea-sized amount to the affected area 3 times daily. 30 g 0   No facility-administered medications prior to visit.    No  Known Allergies  Review of Systems  Constitutional:  Positive for chills, fever and malaise/fatigue.  HENT: Negative.    Eyes: Negative.   Respiratory:  Negative for cough and shortness of breath.   Cardiovascular:  Negative for chest pain, palpitations and leg swelling.  Gastrointestinal:  Positive for abdominal pain and diarrhea. Negative for blood in stool, constipation, nausea and vomiting.  Genitourinary: Negative.   Musculoskeletal: Negative.   Skin: Negative.   Neurological: Negative.   Psychiatric/Behavioral:  Negative for depression. The  patient is not nervous/anxious.   All other systems reviewed and are negative.     Objective:    Physical Exam Vitals reviewed.  Constitutional:      General: She is not in acute distress.    Appearance: Normal appearance. She is normal weight.  HENT:     Head: Normocephalic.     Right Ear: Tympanic membrane, ear canal and external ear normal.     Left Ear: Tympanic membrane, ear canal and external ear normal.     Nose: Nose normal.  Eyes:     Extraocular Movements: Extraocular movements intact.     Conjunctiva/sclera: Conjunctivae normal.     Pupils: Pupils are equal, round, and reactive to light.  Cardiovascular:     Rate and Rhythm: Normal rate and regular rhythm.     Pulses: Normal pulses.     Heart sounds: Normal heart sounds.     Comments: No obvious peripheral edema Pulmonary:     Effort: Pulmonary effort is normal.     Breath sounds: Normal breath sounds.  Abdominal:     General: Abdomen is flat. Bowel sounds are normal.     Palpations: Abdomen is soft.     Comments: Mild tenderness with palpation of diffuse abdomen  Musculoskeletal:        General: Normal range of motion.     Cervical back: Normal range of motion and neck supple.  Skin:    General: Skin is warm and dry.     Capillary Refill: Capillary refill takes less than 2 seconds.  Neurological:     General: No focal deficit present.     Mental Status: She is alert and oriented to person, place, and time.  Psychiatric:        Mood and Affect: Mood normal.        Behavior: Behavior normal.        Thought Content: Thought content normal.        Judgment: Judgment normal.    BP 125/62 (BP Location: Right Arm, Patient Position: Sitting)   Pulse 83   Temp (!) 97.5 F (36.4 C)   Ht 5\' 1"  (1.549 m)   Wt 131 lb 0.4 oz (59.4 kg)   SpO2 99%   BMI 24.76 kg/m  Wt Readings from Last 3 Encounters:  06/26/21 131 lb 0.4 oz (59.4 kg)  09/17/20 130 lb (59 kg)  07/21/20 138 lb (62.6 kg)    Immunization History   Administered Date(s) Administered   Influenza Split 07/24/2012   Influenza,inj,Quad PF,6+ Mos 06/18/2021   Tdap 07/25/2019   Unspecified SARS-COV-2 Vaccination 01/04/2020, 01/18/2020    Diabetic Foot Exam - Simple   No data filed     Lab Results  Component Value Date   TSH 1.065 06/07/2019   Lab Results  Component Value Date   WBC 5.1 06/07/2019   HGB 12.0 06/07/2019   HCT 38.9 06/07/2019   MCV 76.1 (L) 06/07/2019   PLT 169 06/07/2019   Lab  Results  Component Value Date   NA 141 06/07/2019   K 3.6 06/07/2019   CO2 25 06/07/2019   GLUCOSE 97 06/07/2019   BUN 17 06/07/2019   CREATININE 0.70 06/07/2019   BILITOT 0.5 06/07/2019   ALKPHOS 67 06/07/2019   AST 22 06/07/2019   ALT 20 06/07/2019   PROT 7.5 06/07/2019   ALBUMIN 4.3 06/07/2019   CALCIUM 9.1 06/07/2019   ANIONGAP 11 06/07/2019   No results found for: CHOL No results found for: HDL No results found for: LDLCALC No results found for: TRIG No results found for: CHOLHDL Lab Results  Component Value Date   HGBA1C 5.9 (A) 06/26/2021   HGBA1C 5.9 06/26/2021   HGBA1C 5.9 06/26/2021   HGBA1C 5.9 06/26/2021       Assessment & Plan:   Problem List Items Addressed This Visit   None Visit Diagnoses     Healthcare maintenance    -  Primary   Relevant Orders   POCT URINALYSIS DIP (CLINITEK) (Completed)   HIV antibody (with reflex)   CBC with Differential/Platelet   Comprehensive metabolic panel   Lipid panel   Flu Vaccine QUAD 20mo+IM (Fluarix, Fluzone & Alfiuria Quad PF)   MM Digital Screening Discussed appropriate diet and exercise   Screening for diabetes mellitus       Relevant Orders   HgB A1c (Completed)   Glucose (CBG) (Completed)   Urine white blood cells increased       Relevant Orders   Urine Culture   History of thyroidectomy       Relevant Orders   TSH   T3, Free   T4, Free Informed to continue taking medication(s) as prescribed   Generalized abdominal pain       Relevant Orders    Lipase   Ambulatory referral to Gastroenterology   CT Abdomen Pelvis Wo Contrast Concern for diverticulitis versus colitis versus cholelithiasis versus cholecystitis versus gastritis versus other infectious process versus other intra-abdominal etiology Dicyclomine 10 mg as needed for abdominal pain   Patient informed to take OTC medications in addition to prescribed as needed for symptoms    History of thyroid cancer       Relevant Orders   Ambulatory referral to Gastroenterology   Diarrhea, unspecified type       Relevant Orders   CT Abdomen Pelvis Wo Contrast   Follow up in 1 mth for reevaluation of symptoms and TSH, sooner as needed    I have discontinued Keari Marcus's lidocaine and Lidocaine (Anorectal). I am also having her maintain her levothyroxine, docusate sodium, and meloxicam.  No orders of the defined types were placed in this encounter.    Teena Dunk, NP

## 2021-06-27 ENCOUNTER — Ambulatory Visit (HOSPITAL_BASED_OUTPATIENT_CLINIC_OR_DEPARTMENT_OTHER)
Admission: RE | Admit: 2021-06-27 | Discharge: 2021-06-27 | Disposition: A | Discharge status: Home / Self Care | Payer: BC Managed Care – PPO | Source: Ambulatory Visit | Attending: Nurse Practitioner | Admitting: Nurse Practitioner

## 2021-06-27 ENCOUNTER — Other Ambulatory Visit: Payer: Self-pay

## 2021-06-27 DIAGNOSIS — R197 Diarrhea, unspecified: Secondary | ICD-10-CM | POA: Insufficient documentation

## 2021-06-27 DIAGNOSIS — R1084 Generalized abdominal pain: Secondary | ICD-10-CM | POA: Insufficient documentation

## 2021-06-27 LAB — CBC WITH DIFFERENTIAL/PLATELET
Basophils Absolute: 0 10*3/uL (ref 0.0–0.2)
Basos: 1 %
EOS (ABSOLUTE): 0.1 10*3/uL (ref 0.0–0.4)
Eos: 1 %
Hematocrit: 35.7 % (ref 34.0–46.6)
Hemoglobin: 11.5 g/dL (ref 11.1–15.9)
Immature Grans (Abs): 0 10*3/uL (ref 0.0–0.1)
Immature Granulocytes: 0 %
Lymphocytes Absolute: 1.2 10*3/uL (ref 0.7–3.1)
Lymphs: 21 %
MCH: 23.3 pg — ABNORMAL LOW (ref 26.6–33.0)
MCHC: 32.2 g/dL (ref 31.5–35.7)
MCV: 72 fL — ABNORMAL LOW (ref 79–97)
Monocytes Absolute: 0.4 10*3/uL (ref 0.1–0.9)
Monocytes: 7 %
Neutrophils Absolute: 4.2 10*3/uL (ref 1.4–7.0)
Neutrophils: 70 %
Platelets: 196 10*3/uL (ref 150–450)
RBC: 4.93 x10E6/uL (ref 3.77–5.28)
RDW: 14.9 % (ref 11.7–15.4)
WBC: 6 10*3/uL (ref 3.4–10.8)

## 2021-06-27 LAB — COMPREHENSIVE METABOLIC PANEL
ALT: 17 IU/L (ref 0–32)
AST: 22 IU/L (ref 0–40)
Albumin/Globulin Ratio: 1.6 (ref 1.2–2.2)
Albumin: 4.8 g/dL (ref 3.8–4.9)
Alkaline Phosphatase: 112 IU/L (ref 44–121)
BUN/Creatinine Ratio: 20 (ref 9–23)
BUN: 14 mg/dL (ref 6–24)
Bilirubin Total: 0.3 mg/dL (ref 0.0–1.2)
CO2: 21 mmol/L (ref 20–29)
Calcium: 9.3 mg/dL (ref 8.7–10.2)
Chloride: 106 mmol/L (ref 96–106)
Creatinine, Ser: 0.7 mg/dL (ref 0.57–1.00)
Globulin, Total: 3 g/dL (ref 1.5–4.5)
Glucose: 97 mg/dL (ref 65–99)
Potassium: 3.7 mmol/L (ref 3.5–5.2)
Sodium: 143 mmol/L (ref 134–144)
Total Protein: 7.8 g/dL (ref 6.0–8.5)
eGFR: 103 mL/min/{1.73_m2} (ref >59)

## 2021-06-27 LAB — LIPID PANEL
Chol/HDL Ratio: 3.5 ratio (ref 0.0–4.4)
Cholesterol, Total: 154 mg/dL (ref 100–199)
HDL: 44 mg/dL (ref >39)
LDL Chol Calc (NIH): 95 mg/dL (ref 0–99)
Triglycerides: 76 mg/dL (ref 0–149)
VLDL Cholesterol Cal: 15 mg/dL (ref 5–40)

## 2021-06-27 LAB — HIV ANTIBODY (ROUTINE TESTING W REFLEX): HIV Screen 4th Generation wRfx: NONREACTIVE

## 2021-06-27 LAB — TSH: TSH: 0.008 u[IU]/mL — ABNORMAL LOW (ref 0.450–4.500)

## 2021-06-27 LAB — LIPASE: Lipase: 17 U/L (ref 14–72)

## 2021-06-27 LAB — T3, FREE: T3, Free: 3 pg/mL (ref 2.0–4.4)

## 2021-06-27 LAB — T4, FREE: Free T4: 1.63 ng/dL (ref 0.82–1.77)

## 2021-06-27 MED ORDER — IOHEXOL 350 MG/ML SOLN
60.0000 mL | Freq: Once | INTRAVENOUS | Status: AC | PRN
  Administered 2021-06-27: 60 mL via INTRAVENOUS

## 2021-06-28 ENCOUNTER — Other Ambulatory Visit: Payer: Self-pay | Admitting: Nurse Practitioner

## 2021-06-28 DIAGNOSIS — Z85850 Personal history of malignant neoplasm of thyroid: Secondary | ICD-10-CM

## 2021-06-28 DIAGNOSIS — N200 Calculus of kidney: Secondary | ICD-10-CM

## 2021-06-28 DIAGNOSIS — E890 Postprocedural hypothyroidism: Secondary | ICD-10-CM

## 2021-06-28 MED ORDER — LEVOTHYROXINE SODIUM 75 MCG PO TABS: 75.0000 ug | ORAL_TABLET | Freq: Every day | ORAL | 0 refills | Status: DC

## 2021-06-28 MED ORDER — TAMSULOSIN HCL 0.4 MG PO CAPS: 0.4000 mg | ORAL_CAPSULE | Freq: Every day | ORAL | 0 refills | Status: DC

## 2021-06-30 LAB — URINE CULTURE

## 2021-07-01 ENCOUNTER — Ambulatory Visit (HOSPITAL_COMMUNITY)
Admission: RE | Admit: 2021-07-01 | Discharge: 2021-07-01 | Disposition: A | Discharge status: Home / Self Care | Payer: BC Managed Care – PPO | Attending: Urology | Admitting: Urology

## 2021-07-01 ENCOUNTER — Other Ambulatory Visit: Payer: Self-pay | Admitting: Urology

## 2021-07-01 ENCOUNTER — Encounter (HOSPITAL_COMMUNITY)
Admission: RE | Disposition: A | Discharge status: Home / Self Care | Payer: Self-pay | Source: Home / Self Care | Attending: Urology

## 2021-07-01 ENCOUNTER — Ambulatory Visit (HOSPITAL_COMMUNITY): Payer: BC Managed Care – PPO

## 2021-07-01 ENCOUNTER — Encounter (HOSPITAL_COMMUNITY): Payer: Self-pay | Admitting: Urology

## 2021-07-01 DIAGNOSIS — Z79890 Hormone replacement therapy (postmenopausal): Secondary | ICD-10-CM | POA: Insufficient documentation

## 2021-07-01 DIAGNOSIS — Z87442 Personal history of urinary calculi: Secondary | ICD-10-CM | POA: Diagnosis not present

## 2021-07-01 DIAGNOSIS — N132 Hydronephrosis with renal and ureteral calculous obstruction: Secondary | ICD-10-CM | POA: Insufficient documentation

## 2021-07-01 HISTORY — PX: CYSTOSCOPY/URETEROSCOPY/HOLMIUM LASER/STENT PLACEMENT: SHX6546

## 2021-07-01 SURGERY — CYSTOSCOPY/URETEROSCOPY/HOLMIUM LASER/STENT PLACEMENT
Anesthesia: General | Laterality: Right

## 2021-07-01 MED ORDER — LIDOCAINE 2% (20 MG/ML) 5 ML SYRINGE
INTRAMUSCULAR | Status: DC | PRN
Start: 2021-07-01 — End: 2021-07-01
  Administered 2021-07-01: 60 mg via INTRAVENOUS
  Administered 2021-07-01: 40 mg via INTRAVENOUS

## 2021-07-01 MED ORDER — ACETAMINOPHEN 500 MG PO TABS
1000.0000 mg | ORAL_TABLET | Freq: Once | ORAL | Status: AC
  Administered 2021-07-01: 1000 mg via ORAL
  Filled 2021-07-01: qty 2

## 2021-07-01 MED ORDER — MIDAZOLAM HCL 2 MG/2ML IJ SOLN
INTRAMUSCULAR | Status: AC
  Filled 2021-07-01: qty 2

## 2021-07-01 MED ORDER — DEXAMETHASONE SODIUM PHOSPHATE 4 MG/ML IJ SOLN
INTRAMUSCULAR | Status: DC | PRN
  Administered 2021-07-01: 4 mg via INTRAVENOUS

## 2021-07-01 MED ORDER — LACTATED RINGERS IV SOLN: INTRAVENOUS | Status: DC

## 2021-07-01 MED ORDER — FENTANYL CITRATE (PF) 100 MCG/2ML IJ SOLN
INTRAMUSCULAR | Status: DC | PRN
  Administered 2021-07-01 (×2): 25 ug via INTRAVENOUS

## 2021-07-01 MED ORDER — FENTANYL CITRATE PF 50 MCG/ML IJ SOSY: 25.0000 ug | PREFILLED_SYRINGE | INTRAMUSCULAR | Status: DC | PRN

## 2021-07-01 MED ORDER — OXYCODONE HCL 5 MG PO TABS
ORAL_TABLET | ORAL | Status: AC
  Filled 2021-07-01: qty 1

## 2021-07-01 MED ORDER — PROPOFOL 10 MG/ML IV BOLUS
INTRAVENOUS | Status: DC | PRN
  Administered 2021-07-01: 120 mg via INTRAVENOUS

## 2021-07-01 MED ORDER — MIDAZOLAM HCL 2 MG/2ML IJ SOLN
INTRAMUSCULAR | Status: DC | PRN
  Administered 2021-07-01: 2 mg via INTRAVENOUS

## 2021-07-01 MED ORDER — TRAMADOL HCL 50 MG PO TABS
50.0000 mg | ORAL_TABLET | Freq: Four times a day (QID) | ORAL | 0 refills | Status: DC | PRN
Start: 2021-07-01 — End: 2021-07-26

## 2021-07-01 MED ORDER — ONDANSETRON HCL 4 MG/2ML IJ SOLN
INTRAMUSCULAR | Status: DC | PRN
  Administered 2021-07-01: 4 mg via INTRAVENOUS

## 2021-07-01 MED ORDER — SODIUM CHLORIDE 0.9 % IR SOLN
Status: DC | PRN
  Administered 2021-07-01: 6000 mL

## 2021-07-01 MED ORDER — ONDANSETRON HCL 4 MG/2ML IJ SOLN
INTRAMUSCULAR | Status: AC
  Filled 2021-07-01: qty 2

## 2021-07-01 MED ORDER — OXYCODONE HCL 5 MG PO TABS
5.0000 mg | ORAL_TABLET | Freq: Once | ORAL | Status: AC
  Administered 2021-07-01: 5 mg via ORAL

## 2021-07-01 MED ORDER — EPHEDRINE SULFATE-NACL 50-0.9 MG/10ML-% IV SOSY
PREFILLED_SYRINGE | INTRAVENOUS | Status: DC | PRN
  Administered 2021-07-01 (×2): 5 mg via INTRAVENOUS

## 2021-07-01 MED ORDER — FENTANYL CITRATE (PF) 100 MCG/2ML IJ SOLN
INTRAMUSCULAR | Status: AC
  Filled 2021-07-01: qty 2

## 2021-07-01 MED ORDER — CHLORHEXIDINE GLUCONATE 0.12 % MT SOLN
15.0000 mL | Freq: Once | OROMUCOSAL | Status: AC
  Administered 2021-07-01: 15 mL via OROMUCOSAL

## 2021-07-01 MED ORDER — CIPROFLOXACIN IN D5W 400 MG/200ML IV SOLN
400.0000 mg | INTRAVENOUS | Status: AC
  Administered 2021-07-01: 400 mg via INTRAVENOUS
  Filled 2021-07-01: qty 200

## 2021-07-01 MED ORDER — IOHEXOL 300 MG/ML  SOLN
INTRAMUSCULAR | Status: DC | PRN
  Administered 2021-07-01: 15 mL

## 2021-07-01 MED ORDER — OXYBUTYNIN CHLORIDE 5 MG PO TABS: 5.0000 mg | ORAL_TABLET | Freq: Three times a day (TID) | ORAL | 0 refills | Status: DC | PRN

## 2021-07-01 MED ORDER — PROPOFOL 10 MG/ML IV BOLUS
INTRAVENOUS | Status: AC
  Filled 2021-07-01: qty 20

## 2021-07-01 MED ORDER — LIDOCAINE HCL (PF) 2 % IJ SOLN
INTRAMUSCULAR | Status: AC
  Filled 2021-07-01: qty 5

## 2021-07-01 MED ORDER — DEXAMETHASONE SODIUM PHOSPHATE 10 MG/ML IJ SOLN
INTRAMUSCULAR | Status: AC
  Filled 2021-07-01: qty 1

## 2021-07-01 SURGICAL SUPPLY — 21 items
BAG URO CATCHER STRL LF (MISCELLANEOUS) ×2 IMPLANT
BASKET ZERO TIP NITINOL 2.4FR (BASKET) IMPLANT
BSKT STON RTRVL ZERO TP 2.4FR (BASKET)
CATH URET 5FR 28IN OPEN ENDED (CATHETERS) ×2 IMPLANT
CLOTH BEACON ORANGE TIMEOUT ST (SAFETY) ×2 IMPLANT
COVER DOME SNAP 22 D (MISCELLANEOUS) ×1 IMPLANT
DRSG TEGADERM 2-3/8X2-3/4 SM (GAUZE/BANDAGES/DRESSINGS) IMPLANT
EXTRACTOR STONE 1.7FRX115CM (UROLOGICAL SUPPLIES) IMPLANT
FIBER LASER MOSES 200 DFL (Laser) ×1 IMPLANT
GLOVE SURG ENC MOIS LTX SZ6.5 (GLOVE) ×2 IMPLANT
GOWN STRL REUS W/TWL LRG LVL3 (GOWN DISPOSABLE) ×2 IMPLANT
GUIDEWIRE STR DUAL SENSOR (WIRE) ×2 IMPLANT
GUIDEWIRE ZIPWRE .038 STRAIGHT (WIRE) ×1 IMPLANT
KIT TURNOVER KIT A (KITS) ×2 IMPLANT
MANIFOLD NEPTUNE II (INSTRUMENTS) ×2 IMPLANT
PACK CYSTO (CUSTOM PROCEDURE TRAY) ×2 IMPLANT
SHEATH URETERAL 12FRX28CM (UROLOGICAL SUPPLIES) IMPLANT
SHEATH URETERAL 12FRX35CM (MISCELLANEOUS) IMPLANT
STENT URET 6FRX24 CONTOUR (STENTS) ×1 IMPLANT
TUBING CONNECTING 10 (TUBING) ×2 IMPLANT
TUBING UROLOGY SET (TUBING) ×2 IMPLANT

## 2021-07-01 NOTE — H&P (Signed)
CC/HPI: cc: urolithiasis   07/01/21: 55 yo woman presents to the ED with abdominal pain and found to have a 1.5 cm stone in the right proximal ureter with hydronephrosis. Patient also has significant right lower pole stone burden. Patient has a prior history of kidney stones and had ESWL in the past. She denies fever, chills, nausea or vomiting. Her pain is okay right now. Patient speaks dialect of [place] and daughter is here to translate.     ALLERGIES: No Known Allergies    MEDICATIONS: Levothyroxine     GU PSH: None   NON-GU PSH: Thyroidectomy     GU PMH: None   NON-GU PMH: GERD Malignant neoplasm of thyroid gland    FAMILY HISTORY: 2 daughters - Runs in Family 5 sons - Runs in Family   SOCIAL HISTORY: Marital Status: Married Preferred Language: Spanish; Castilian; Ethnicity: ; Race: Other Race Current Smoking Status: Patient has never smoked.   Tobacco Use Assessment Completed: Used Tobacco in last 30 days? Has never drank.  Drinks 1 caffeinated drink per day.    REVIEW OF SYSTEMS:    GU Review Female:   Patient denies frequent urination, hard to postpone urination, burning /pain with urination, get up at night to urinate, leakage of urine, stream starts and stops, trouble starting your stream, have to strain to urinate, and being pregnant.  Gastrointestinal (Upper):   Patient denies nausea, vomiting, and indigestion/ heartburn.  Gastrointestinal (Lower):   Patient denies diarrhea and constipation.  Constitutional:   Patient denies fever, night sweats, weight loss, and fatigue.  Skin:   Patient denies skin rash/ lesion and itching.  Eyes:   Patient denies blurred vision and double vision.  Ears/ Nose/ Throat:   Patient denies sore throat and sinus problems.  Hematologic/Lymphatic:   Patient denies swollen glands and easy bruising.  Cardiovascular:   Patient denies leg swelling and chest pains.  Respiratory:   Patient denies cough and shortness of breath.   Endocrine:   Patient denies excessive thirst.  Musculoskeletal:   Patient denies back pain and joint pain.  Neurological:   Patient denies headaches and dizziness.  Psychologic:   Patient denies anxiety and depression.   VITAL SIGNS:      07/01/2021 09:23 AM  Weight 132 lb / 59.87 kg  Height 61 in / 154.94 cm  BP 110/62 mmHg  Pulse 78 /min  Temperature 96.4 F / 35.7 C  BMI 24.9 kg/m   GU PHYSICAL EXAMINATION:      Notes: No CVA ttp    MULTI-SYSTEM PHYSICAL EXAMINATION:    Constitutional: Well-nourished. No physical deformities. Normally developed. Good grooming.  Neck: Neck symmetrical, not swollen. Normal tracheal position.  Respiratory: No labored breathing, no use of accessory muscles.   Skin: No paleness, no jaundice, no cyanosis. No lesion, no ulcer, no rash.  Neurologic / Psychiatric: Oriented to time, oriented to place, oriented to person. No depression, no anxiety, no agitation.  Gastrointestinal: No tenderness, no rigidity, non obese abdomen.   Eyes: Normal conjunctivae. Normal eyelids.  Ears, Nose, Mouth, and Throat: Left ear no scars, no lesions, no masses. Right ear no scars, no lesions, no masses. Nose no scars, no lesions, no masses. Normal hearing. Normal lips.  Musculoskeletal: Normal gait and station of head and neck.     Complexity of Data:  Records Review:   Previous Hospital Records, POC Tool  Urine Test Review:   Urinalysis  X-Ray Review: C.T. Abdomen/Pelvis: Reviewed Films. Reviewed Report. Discussed With Patient. IMPRESSION:  1. Multiple prominent stones within the right renal collecting system including a 1.7 x 0.8 cm ovoid stone within the proximal right ureter near the level of the ureteropelvic junction. Moderate right hydroureteronephrosis. 2. Proximal to mid right ureteral wall is mildly thickened, which may be reactive. Electronically Signed By: Davina Poke D.O. On: 06/27/2021 11:17   Notes:                     06/26/21: BUN 14, Cr 0.7    PROCEDURES:          Urinalysis w/Scope Dipstick Dipstick Cont'd Micro  Color: Straw Bilirubin: Neg mg/dL WBC/hpf: 6 - 10/hpf  Appearance: Clear Ketones: Neg mg/dL RBC/hpf: 10 - 20/hpf  Specific Gravity: 1.020 Blood: 1+ ery/uL Bacteria: NS (Not Seen)  pH: 6.0 Protein: Neg mg/dL Cystals: NS (Not Seen)  Glucose: Neg mg/dL Urobilinogen: 0.2 mg/dL Casts: WBC    Nitrites: Neg Trichomonas: Not Present    Leukocyte Esterase: 1+ leu/uL Mucous: Not Present      Epithelial Cells: NS (Not Seen)      Yeast: NS (Not Seen)      Sperm: Not Present    ASSESSMENT:      ICD-10 Details  1 GU:   Renal calculus - N20.0 Chronic, Stable  2   Ureteral calculus - M22.6 Acute, Uncomplicated  3   Ureteral obstruction secondary to calculous - J33.5 Acute, Uncomplicated   PLAN:           Document Letter(s):  Created for Patient: Clinical Summary         Notes:   Reviewed patient's CT scan from 06/27/2021. Discussed with patient her daughter that this will likely be a staged procedure. Given size of proximal ureteral stone and hydronephrosis would like to proceed with at minimum stent placement today but also possible ureteroscopy with laser lithotripsy. Risks and benefits of the above procedure were discussed with the patient and her daughter in detail. These include but are not limited to bleeding, infection, pain, stent discomfort, need for staged procedure, inability remove stones, need for additional procedure, damage to surrounding structures, ureteral stricture. Will proceed with surgery today.

## 2021-07-01 NOTE — Anesthesia Preprocedure Evaluation (Addendum)
Anesthesia Evaluation  Patient identified by MRN, date of birth, ID band Patient awake    Reviewed: Allergy & Precautions, H&P , NPO status , Patient's Chart, lab work & pertinent test results  Airway Mallampati: II  TM Distance: >3 FB Neck ROM: Full    Dental no notable dental hx. (+) Teeth Intact, Dental Advisory Given   Pulmonary neg pulmonary ROS,    Pulmonary exam normal breath sounds clear to auscultation       Cardiovascular negative cardio ROS   Rhythm:Regular Rate:Normal     Neuro/Psych negative neurological ROS  negative psych ROS   GI/Hepatic negative GI ROS, Neg liver ROS,   Endo/Other  Hypothyroidism   Renal/GU negative Renal ROS  negative genitourinary   Musculoskeletal   Abdominal   Peds  Hematology  (+) Blood dyscrasia, anemia ,   Anesthesia Other Findings   Reproductive/Obstetrics negative OB ROS                            Anesthesia Physical Anesthesia Plan  ASA: 2  Anesthesia Plan: General   Post-op Pain Management:    Induction: Intravenous  PONV Risk Score and Plan: 4 or greater and Ondansetron, Dexamethasone and Midazolam  Airway Management Planned: LMA  Additional Equipment:   Intra-op Plan:   Post-operative Plan: Extubation in OR  Informed Consent: I have reviewed the patients History and Physical, chart, labs and discussed the procedure including the risks, benefits and alternatives for the proposed anesthesia with the patient or authorized representative who has indicated his/her understanding and acceptance.     Dental advisory given and Interpreter used for interveiw  Plan Discussed with: CRNA  Anesthesia Plan Comments:        Anesthesia Quick Evaluation

## 2021-07-01 NOTE — H&P (View-Only) (Signed)
CC/HPI: cc: urolithiasis   07/01/21: 55 yo woman presents to the ED with abdominal pain and found to have a 1.5 cm stone in the right proximal ureter with hydronephrosis. Patient also has significant right lower pole stone burden. Patient has a prior history of kidney stones and had ESWL in the past. She denies fever, chills, nausea or vomiting. Her pain is okay right now. Patient speaks dialect of [place] and daughter is here to translate.     ALLERGIES: No Known Allergies    MEDICATIONS: Levothyroxine     GU PSH: None   NON-GU PSH: Thyroidectomy     GU PMH: None   NON-GU PMH: GERD Malignant neoplasm of thyroid gland    FAMILY HISTORY: 2 daughters - Runs in Family 5 sons - Runs in Family   SOCIAL HISTORY: Marital Status: Married Preferred Language: Spanish; Castilian; Ethnicity: ; Race: Other Race Current Smoking Status: Patient has never smoked.   Tobacco Use Assessment Completed: Used Tobacco in last 30 days? Has never drank.  Drinks 1 caffeinated drink per day.    REVIEW OF SYSTEMS:    GU Review Female:   Patient denies frequent urination, hard to postpone urination, burning /pain with urination, get up at night to urinate, leakage of urine, stream starts and stops, trouble starting your stream, have to strain to urinate, and being pregnant.  Gastrointestinal (Upper):   Patient denies nausea, vomiting, and indigestion/ heartburn.  Gastrointestinal (Lower):   Patient denies diarrhea and constipation.  Constitutional:   Patient denies fever, night sweats, weight loss, and fatigue.  Skin:   Patient denies skin rash/ lesion and itching.  Eyes:   Patient denies blurred vision and double vision.  Ears/ Nose/ Throat:   Patient denies sore throat and sinus problems.  Hematologic/Lymphatic:   Patient denies swollen glands and easy bruising.  Cardiovascular:   Patient denies leg swelling and chest pains.  Respiratory:   Patient denies cough and shortness of breath.   Endocrine:   Patient denies excessive thirst.  Musculoskeletal:   Patient denies back pain and joint pain.  Neurological:   Patient denies headaches and dizziness.  Psychologic:   Patient denies anxiety and depression.   VITAL SIGNS:      07/01/2021 09:23 AM  Weight 132 lb / 59.87 kg  Height 61 in / 154.94 cm  BP 110/62 mmHg  Pulse 78 /min  Temperature 96.4 F / 35.7 C  BMI 24.9 kg/m   GU PHYSICAL EXAMINATION:      Notes: No CVA ttp    MULTI-SYSTEM PHYSICAL EXAMINATION:    Constitutional: Well-nourished. No physical deformities. Normally developed. Good grooming.  Neck: Neck symmetrical, not swollen. Normal tracheal position.  Respiratory: No labored breathing, no use of accessory muscles.   Skin: No paleness, no jaundice, no cyanosis. No lesion, no ulcer, no rash.  Neurologic / Psychiatric: Oriented to time, oriented to place, oriented to person. No depression, no anxiety, no agitation.  Gastrointestinal: No tenderness, no rigidity, non obese abdomen.   Eyes: Normal conjunctivae. Normal eyelids.  Ears, Nose, Mouth, and Throat: Left ear no scars, no lesions, no masses. Right ear no scars, no lesions, no masses. Nose no scars, no lesions, no masses. Normal hearing. Normal lips.  Musculoskeletal: Normal gait and station of head and neck.     Complexity of Data:  Records Review:   Previous Hospital Records, POC Tool  Urine Test Review:   Urinalysis  X-Ray Review: C.T. Abdomen/Pelvis: Reviewed Films. Reviewed Report. Discussed With Patient. IMPRESSION:  1. Multiple prominent stones within the right renal collecting system including a 1.7 x 0.8 cm ovoid stone within the proximal right ureter near the level of the ureteropelvic junction. Moderate right hydroureteronephrosis. 2. Proximal to mid right ureteral wall is mildly thickened, which may be reactive. Electronically Signed By: Davina Poke D.O. On: 06/27/2021 11:17   Notes:                     06/26/21: BUN 14, Cr 0.7    PROCEDURES:          Urinalysis w/Scope Dipstick Dipstick Cont'd Micro  Color: Straw Bilirubin: Neg mg/dL WBC/hpf: 6 - 10/hpf  Appearance: Clear Ketones: Neg mg/dL RBC/hpf: 10 - 20/hpf  Specific Gravity: 1.020 Blood: 1+ ery/uL Bacteria: NS (Not Seen)  pH: 6.0 Protein: Neg mg/dL Cystals: NS (Not Seen)  Glucose: Neg mg/dL Urobilinogen: 0.2 mg/dL Casts: WBC    Nitrites: Neg Trichomonas: Not Present    Leukocyte Esterase: 1+ leu/uL Mucous: Not Present      Epithelial Cells: NS (Not Seen)      Yeast: NS (Not Seen)      Sperm: Not Present    ASSESSMENT:      ICD-10 Details  1 GU:   Renal calculus - N20.0 Chronic, Stable  2   Ureteral calculus - K02.5 Acute, Uncomplicated  3   Ureteral obstruction secondary to calculous - K27.0 Acute, Uncomplicated   PLAN:           Document Letter(s):  Created for Patient: Clinical Summary         Notes:   Reviewed patient's CT scan from 06/27/2021. Discussed with patient her daughter that this will likely be a staged procedure. Given size of proximal ureteral stone and hydronephrosis would like to proceed with at minimum stent placement today but also possible ureteroscopy with laser lithotripsy. Risks and benefits of the above procedure were discussed with the patient and her daughter in detail. These include but are not limited to bleeding, infection, pain, stent discomfort, need for staged procedure, inability remove stones, need for additional procedure, damage to surrounding structures, ureteral stricture. Will proceed with surgery today.

## 2021-07-01 NOTE — Discharge Instructions (Signed)
DISCHARGE INSTRUCTIONS FOR KIDNEY STONE/URETERAL STENT   MEDICATIONS:  1. Resume all your other meds from home  2. AZO over the counter can help with the burning/stinging when you urinate. 3. Tramadol is for moderate/severe pain, otherwise taking up to 1000 mg every 6 hours of plainTylenol will help treat your pain.    It is normal to see blood in the urine and experience urinary frequency and urgency with stent in place.    ACTIVITY:  1. No strenuous activity x 1week  2. No driving while on narcotic pain medications  3. Drink plenty of water  4. Continue to walk at home - you can still get blood clots when you are at home, so keep active, but don't over do it.  5. May return to work/school tomorrow or when you feel ready   BATHING:  1. You can shower and we recommend daily showers    SIGNS/SYMPTOMS TO CALL:  Please call us if you have a fever greater than 101.5, uncontrolled nausea/vomiting, uncontrolled pain, dizziness, unable to urinate, bloody urine, chest pain, shortness of breath, leg swelling, leg pain, redness around wound, drainage from wound, or any other concerns or questions.   You can reach [place] at [phone].   FOLLOW-UP:  1. You will be called with your next surgery date

## 2021-07-01 NOTE — Interval H&P Note (Signed)
History and Physical Interval Note:  07/01/2021 3:27 PM  Marissa [name]  has presented today for surgery, with the diagnosis of right ureteral stone.  The various methods of treatment have been discussed with the patient and family. After consideration of risks, benefits and other options for treatment, the patient has consented to  Procedure(s): CYSTOSCOPY/URETEROSCOPY/HOLMIUM LASER/STENT PLACEMENT (Right) as a surgical intervention.  The patient's history has been reviewed, patient examined, no change in status, stable for surgery.  I have reviewed the patient's chart and labs.  Questions were answered to the patient's satisfaction.     Heitor Steinhoff D Hulbert Branscome

## 2021-07-01 NOTE — Anesthesia Procedure Notes (Signed)
Procedure Name: LMA Insertion Date/Time: 07/01/2021 3:25 PM Performed by: Niel Hummer, CRNA Pre-anesthesia Checklist: Patient identified, Emergency Drugs available, Suction available and Patient being monitored Patient Marissa [name]:Patient Marissa [name] prior to induction Oxygen Delivery Method: Circle system utilized Preoxygenation: Pre-oxygenation with 100% oxygen Induction Type: IV induction LMA: LMA inserted LMA Size: 4.0 Number of attempts: 1 Dental Injury: Teeth and Oropharynx as per pre-operative assessment

## 2021-07-01 NOTE — Anesthesia Postprocedure Evaluation (Signed)
Anesthesia Post Note  Patient: Maytal Forlenza  Procedure(s) Performed: CYSTOSCOPY/RETROGRADE/URETEROSCOPY/HOLMIUM LASER/STENT PLACEMENT (Right)     Patient location during evaluation: PACU Anesthesia Type: General Level of consciousness: awake and alert Pain management: pain level controlled Vital Signs Assessment: post-procedure vital signs reviewed and stable Respiratory status: spontaneous breathing, nonlabored ventilation, respiratory function stable and patient connected to nasal cannula oxygen Cardiovascular status: blood pressure returned to baseline and stable Postop Assessment: no apparent nausea or vomiting Anesthetic complications: no   No notable events documented.  Last Vitals:  Vitals:   07/01/21 1633 07/01/21 1645  BP: 132/72 132/86  Pulse: 93 91  Resp: 20 14  Temp: (!) 36.3 C   SpO2: 100% 99%    Last Pain:  Vitals:   07/01/21 1645  TempSrc:   PainSc: 0-No pain                 Brookes Craine S

## 2021-07-01 NOTE — Progress Notes (Signed)
Spoke with pts daughter. Aware to arrive at [facility] admitting at 12:!5 pm for scheduled surgical procedure.

## 2021-07-01 NOTE — Op Note (Signed)
Preoperative diagnosis: right ureteral calculus with hydronephrosis  Postoperative diagnosis: right ureteral calculus with hydronephrosis  Procedure:  Cystoscopy right ureteroscopy, laser lithotripsy, basket stone extraction right 66F x 24 ureteral stent placement - no tether right retrograde pyelography with interpretation  Surgeon: Jacalyn Lefevre, MD  Anesthesia: General  Complications: None  Intraoperative findings:  Normal urethra Bilateral orthotropic ureteral orifices right retrograde pyelography demonstrated a filling defect within the right ureter consistent with the patient's known calculus without other abnormalities and severe hydronephrosis Bladder mucosa normal without masses   EBL: Minimal  Specimens: none  Disposition of specimens: [facility] for stone analysis  Indication: Marissa [name] is a 55 y.o.   patient with a who presented to the ED with right flank pain found to have a 1.7 cm right proximal stone with significant right lower pole stone burden.  There was moderate to severe hydronephrosis noted on CT scan done in the ED.  Right ureteral stone and associated right symptoms. After reviewing the management options for treatment, the patient elected to proceed with the above surgical procedure(s). We have discussed the potential benefits and risks of the procedure, side effects of the proposed treatment, the likelihood of the patient achieving the goals of the procedure, and any potential problems that might occur during the procedure or recuperation. Informed consent has been obtained.   Description of procedure:  The patient was taken to the operating room and general anesthesia was induced.  The patient was placed in the dorsal lithotomy position, prepped and draped in the usual sterile fashion, and preoperative antibiotics were administered. A preoperative time-out was performed.   Cystourethroscopy was performed.  The patient's urethra was  examined and was normal. The bladder was then systematically examined in its entirety. There was no evidence for any bladder tumors, stones, or other mucosal pathology.    Attention then turned to the right ureteral orifice and a ureteral catheter was used to intubate the ureteral orifice.  Omnipaque contrast was injected through the ureteral catheter and a retrograde pyelogram was performed with findings as dictated above.  Multiple attempts at passing a sensor wire and zip wire were unsuccessful due to obstruction from the stone.  Decision was made to proceed with diagnostic ureteroscopy given how dilated her ureter appeared on retrograde pyelogram.  4.5 French semirigid ureteroscope was then placed in the urethral meatus and advanced into the right ureteral orifice.  It then easily traversed to the proximal ureter where the stone was seen.  Again attempts at passing a wire under direct visualization were unsuccessful.  Decision was made to proceed with holmium laser lithotripsy to alleviate obstruction and then passed a safety wire.    A 200 m holmium laser was then used on the settings of 0.3 and 60 Hz.  The stone broke up nicely and there is an opening to see proximally.  A 0.38 sensor wire was then advanced under direct visualization to the kidney past the impacted stone.  This wire was secured as a safety wire.  Laser lithotripsy then continued until the stone was completely fragmented.  The plan was for this to be a staged procedure and so after the stone was broken up a stent was placed in standard fashion.   The wire was then backloaded through the cystoscope and a ureteral stent was advance over the wire using Seldinger technique.  The stent was positioned appropriately under fluoroscopic and cystoscopic guidance.  The wire was then removed with an adequate stent curl noted in  the renal pelvis as well as in the bladder.  The bladder was then emptied and the procedure ended.  The patient  appeared to tolerate the procedure well and without complications.  The patient was able to be awakened and transferred to the recovery unit in satisfactory condition.   Disposition: The string was removed from the stent.  The patient be scheduled for staged ureteroscopy in 2 to 3 weeks.

## 2021-07-01 NOTE — Transfer of Care (Signed)
Immediate Anesthesia Transfer of Care Note  Patient: Marissa [name]  Procedure(s) Performed: CYSTOSCOPY/RETROGRADE/URETEROSCOPY/HOLMIUM LASER/STENT PLACEMENT (Right)  Patient Location: PACU  Anesthesia Type:General  Level of Consciousness: drowsy  Airway & Oxygen Therapy: Patient Spontanous Breathing and Patient connected to face mask oxygen  Post-op Assessment: Report given to RN and Post -op Vital signs reviewed and stable  Post vital signs: Reviewed and stable  Last Vitals:  Vitals Value Taken Time  BP 132/72 07/01/21 1632  Temp    Pulse 103 07/01/21 1636  Resp 19 07/01/21 1636  SpO2 100 % 07/01/21 1636  Vitals shown include unvalidated device data.  Last Pain:  Vitals:   07/01/21 1241  TempSrc:   PainSc: 0-No pain         Complications: No notable events documented.

## 2021-07-02 ENCOUNTER — Encounter (HOSPITAL_COMMUNITY): Payer: Self-pay | Admitting: Urology

## 2021-07-04 ENCOUNTER — Other Ambulatory Visit: Payer: Self-pay | Admitting: Urology

## 2021-07-09 ENCOUNTER — Encounter: Payer: Self-pay | Admitting: Gastroenterology

## 2021-07-10 NOTE — Patient Instructions (Addendum)
DUE TO COVID-19 ONLY ONE VISITOR IS ALLOWED TO COME WITH YOU AND STAY IN THE WAITING ROOM ONLY DURING PRE OP AND PROCEDURE.   **NO VISITORS ARE ALLOWED IN THE SHORT STAY AREA OR RECOVERY ROOM!!**       Your procedure is scheduled on: 07/16/21   Report to [facility] Main Entrance   Report to Short Stay at 5:15 AM   [facility])   Call this number if you have problems the morning of surgery [phone]   Do not eat food :After Midnight.   May have liquids until 4:30 AM day of surgery  CLEAR LIQUID DIET  Foods Allowed                                                                     Foods Excluded  Water, Black Coffee and tea (no milk or creamer)            liquids that you cannot  Plain Jell-O in any flavor  (No red)                                     see through such as: Fruit ices (not with fruit pulp)                                             milk, soups, orange juice              Iced Popsicles (No red)                                                All solid food                                   Apple juices Sports drinks like Gatorade (No red) Lightly seasoned clear broth or consume(fat free) Sugar    Oral Hygiene is also important to reduce your risk of infection.                                    Remember - BRUSH YOUR TEETH THE MORNING OF SURGERY WITH YOUR REGULAR TOOTHPASTE   Take these medicines the morning of surgery with A SIP OF WATER: Tylenol, Synthroid, Ditropan, Tramadol.                               You may not have any metal on your body including hair pins, jewelry, and body piercing             Do not wear make-up, lotions, powders, perfumes, or deodorant  Do not wear nail polish including gel and S&S, artificial/acrylic nails, or any other type of covering on natural nails including finger and toenails. If  you have artificial nails, gel coating, etc. that needs to be removed by a nail salon please have this removed prior to surgery or  surgery may need to be canceled/ delayed if the surgeon/ anesthesia feels like they are unable to be safely monitored.   Do not shave  48 hours prior to surgery.    Do not bring valuables to the hospital. [place].    Patients discharged on the day of surgery will not be allowed to drive home.  Special Instructions: Bring a copy of your healthcare power of attorney and living will documents         the day of surgery if you haven't scanned them before.   Please read over the following fact sheets you were given: IF YOU HAVE QUESTIONS ABOUT YOUR PRE-OP INSTRUCTIONS PLEASE CALL [facility] - Preparing for Surgery Before surgery, you can play an important role.  Because skin is not sterile, your skin needs to be as free of germs as possible.  You can reduce the number of germs on your skin by washing with CHG (chlorahexidine gluconate) soap before surgery.  CHG is an antiseptic cleaner which kills germs and bonds with the skin to continue killing germs even after washing. Please DO NOT use if you have an allergy to CHG or antibacterial soaps.  If your skin becomes reddened/irritated stop using the CHG and inform your nurse when you arrive at Short Stay. Do not shave (including legs and underarms) for at least 48 hours prior to the first CHG shower.  You may shave your face/neck.  Please follow these instructions carefully:  1.  Shower with CHG Soap the night before surgery and the  morning of surgery.  2.  If you choose to wash your hair, wash your hair first as usual with your normal  shampoo.  3.  After you shampoo, rinse your hair and body thoroughly to remove the shampoo.                             4.  Use CHG as you would any other liquid soap.  You can apply chg directly to the skin and wash.  Gently with a scrungie or clean washcloth.  5.  Apply the CHG Soap to your body ONLY FROM THE NECK DOWN.   Do   not use on  face/ open                           Wound or open sores. Avoid contact with eyes, ears mouth and   genitals (private parts).                       Wash face,  Genitals (private parts) with your normal soap.             6.  Wash thoroughly, paying special attention to the area where your    surgery  will be performed.  7.  Thoroughly rinse your body with warm water from the neck down.  8.  DO NOT shower/wash with your normal soap after using and rinsing off the CHG Soap.                9.  Pat yourself dry with a clean towel.  10.  Wear clean pajamas.            11.  Place clean sheets on your bed the night of your first shower and do not  sleep with pets. Day of Surgery : Do not apply any lotions/deodorants the morning of surgery.  Please wear clean clothes to the hospital/surgery center.  FAILURE TO FOLLOW THESE INSTRUCTIONS MAY RESULT IN THE CANCELLATION OF YOUR SURGERY  PATIENT SIGNATURE_________________________________  NURSE SIGNATURE__________________________________  ________________________________________________________________________

## 2021-07-10 NOTE — Progress Notes (Addendum)
COVID swab appointment: n/a  COVID Vaccine Completed: yes x2 Date COVID Vaccine completed: 01/04/20, 01/18/20 Has received booster: COVID vaccine manufacturer: [employer]   Date of COVID positive in last 90 days: no  PCP - Bo Merino, NP Cardiologist - n/a  Chest x-ray - n/a EKG - n/a Stress Test - n/a ECHO - n/a Cardiac Cath - n/a Pacemaker/ICD device last checked: n/a Spinal Cord Stimulator:n/a  Sleep Study - n/a CPAP -   Fasting Blood Sugar - n/a Checks Blood Sugar _____ times a day  Blood Thinner Instructions: n/a Aspirin Instructions: Last Dose:  Activity level: Can go up a flight of stairs and perform activities of daily living without stopping and without symptoms of chest pain or shortness of breath.      Anesthesia review:   Patient denies shortness of breath, fever, cough and chest pain at PAT appointment   Patient verbalized understanding of instructions that were given to them at the PAT appointment. Patient was also instructed that they will need to review over the PAT instructions again at home before surgery.

## 2021-07-11 ENCOUNTER — Encounter (HOSPITAL_COMMUNITY)
Admission: RE | Admit: 2021-07-11 | Discharge: 2021-07-11 | Disposition: A | Discharge status: Home / Self Care | Payer: BC Managed Care – PPO | Source: Ambulatory Visit | Attending: Urology | Admitting: Urology

## 2021-07-11 ENCOUNTER — Other Ambulatory Visit: Payer: Self-pay

## 2021-07-11 ENCOUNTER — Encounter (HOSPITAL_COMMUNITY): Payer: Self-pay

## 2021-07-11 DIAGNOSIS — N202 Calculus of kidney with calculus of ureter: Secondary | ICD-10-CM | POA: Insufficient documentation

## 2021-07-11 DIAGNOSIS — N200 Calculus of kidney: Secondary | ICD-10-CM | POA: Diagnosis present

## 2021-07-11 HISTORY — DX: Personal history of urinary calculi: Z87.442

## 2021-07-15 NOTE — Anesthesia Preprocedure Evaluation (Addendum)
Anesthesia Evaluation  Patient identified by MRN, date of birth, ID band Patient awake    Reviewed: Allergy & Precautions, NPO status , Patient's Chart, lab work & pertinent test results  Airway Mallampati: II  TM Distance: >3 FB Neck ROM: Full    Dental no notable dental hx.    Pulmonary neg pulmonary ROS,    Pulmonary exam normal breath sounds clear to auscultation       Cardiovascular negative cardio ROS Normal cardiovascular exam Rhythm:Regular Rate:Normal     Neuro/Psych negative neurological ROS  negative psych ROS   GI/Hepatic negative GI ROS, Neg liver ROS,   Endo/Other  Hypothyroidism   Renal/GU negative Renal ROS     Musculoskeletal negative musculoskeletal ROS (+)   Abdominal   Peds  Hematology negative hematology ROS (+)   Anesthesia Other Findings RIGHT URETERAL AND RENAL CALCULI  Reproductive/Obstetrics                            Anesthesia Physical Anesthesia Plan  ASA: 2  Anesthesia Plan: General   Post-op Pain Management:    Induction: Intravenous  PONV Risk Score and Plan: 4 or greater and Ondansetron, Dexamethasone, Midazolam and Treatment may vary due to age or medical condition  Airway Management Planned: LMA  Additional Equipment:   Intra-op Plan:   Post-operative Plan: Extubation in OR  Informed Consent: I have reviewed the patients History and Physical, chart, labs and discussed the procedure including the risks, benefits and alternatives for the proposed anesthesia with the patient or authorized representative who has indicated his/her understanding and acceptance.     Dental advisory given and Interpreter used for interveiw  Plan Discussed with: CRNA  Anesthesia Plan Comments: (Anesthetic plan discussed with daughter, who assisted in interpretation. )        Anesthesia Quick Evaluation

## 2021-07-16 ENCOUNTER — Ambulatory Visit (HOSPITAL_COMMUNITY)
Admission: RE | Admit: 2021-07-16 | Discharge: 2021-07-16 | Disposition: A | Discharge status: Home / Self Care | Payer: Medicaid Other | Source: Ambulatory Visit | Attending: Urology | Admitting: Urology

## 2021-07-16 ENCOUNTER — Encounter (HOSPITAL_COMMUNITY): Payer: Self-pay | Admitting: Urology

## 2021-07-16 ENCOUNTER — Ambulatory Visit (HOSPITAL_COMMUNITY): Payer: Medicaid Other | Admitting: Certified Registered"

## 2021-07-16 ENCOUNTER — Encounter (HOSPITAL_COMMUNITY)
Admission: RE | Disposition: A | Discharge status: Home / Self Care | Payer: Self-pay | Source: Ambulatory Visit | Attending: Urology

## 2021-07-16 ENCOUNTER — Ambulatory Visit (HOSPITAL_COMMUNITY): Payer: Medicaid Other

## 2021-07-16 DIAGNOSIS — N132 Hydronephrosis with renal and ureteral calculous obstruction: Secondary | ICD-10-CM | POA: Insufficient documentation

## 2021-07-16 DIAGNOSIS — Z87442 Personal history of urinary calculi: Secondary | ICD-10-CM | POA: Insufficient documentation

## 2021-07-16 HISTORY — PX: CYSTOSCOPY/URETEROSCOPY/HOLMIUM LASER/STENT PLACEMENT: SHX6546

## 2021-07-16 SURGERY — CYSTOSCOPY/URETEROSCOPY/HOLMIUM LASER/STENT PLACEMENT
Anesthesia: General | Site: Renal | Laterality: Right

## 2021-07-16 MED ORDER — FENTANYL CITRATE (PF) 100 MCG/2ML IJ SOLN
INTRAMUSCULAR | Status: AC
  Filled 2021-07-16: qty 2

## 2021-07-16 MED ORDER — OXYCODONE HCL 5 MG PO TABS
ORAL_TABLET | ORAL | Status: AC
  Filled 2021-07-16: qty 1

## 2021-07-16 MED ORDER — ONDANSETRON HCL 4 MG/2ML IJ SOLN
INTRAMUSCULAR | Status: DC | PRN
  Administered 2021-07-16: 4 mg via INTRAVENOUS

## 2021-07-16 MED ORDER — ACETAMINOPHEN 500 MG PO TABS
1000.0000 mg | ORAL_TABLET | Freq: Once | ORAL | Status: AC
  Administered 2021-07-16: 1000 mg via ORAL
  Filled 2021-07-16: qty 2

## 2021-07-16 MED ORDER — ORAL CARE MOUTH RINSE: 15.0000 mL | Freq: Once | OROMUCOSAL | Status: AC

## 2021-07-16 MED ORDER — CEPHALEXIN 500 MG PO CAPS: 500.0000 mg | ORAL_CAPSULE | Freq: Two times a day (BID) | ORAL | 0 refills | Status: AC

## 2021-07-16 MED ORDER — PROMETHAZINE HCL 25 MG/ML IJ SOLN: 6.2500 mg | INTRAMUSCULAR | Status: DC | PRN

## 2021-07-16 MED ORDER — DEXAMETHASONE SODIUM PHOSPHATE 10 MG/ML IJ SOLN
INTRAMUSCULAR | Status: AC
  Filled 2021-07-16: qty 1

## 2021-07-16 MED ORDER — MIDAZOLAM HCL 2 MG/2ML IJ SOLN
INTRAMUSCULAR | Status: DC | PRN
  Administered 2021-07-16 (×2): 1 mg via INTRAVENOUS

## 2021-07-16 MED ORDER — ONDANSETRON HCL 4 MG/2ML IJ SOLN
INTRAMUSCULAR | Status: AC
  Filled 2021-07-16: qty 2

## 2021-07-16 MED ORDER — PROPOFOL 10 MG/ML IV BOLUS
INTRAVENOUS | Status: AC
  Filled 2021-07-16: qty 40

## 2021-07-16 MED ORDER — MIDAZOLAM HCL 2 MG/2ML IJ SOLN
INTRAMUSCULAR | Status: AC
  Filled 2021-07-16: qty 2

## 2021-07-16 MED ORDER — DEXAMETHASONE SODIUM PHOSPHATE 10 MG/ML IJ SOLN
INTRAMUSCULAR | Status: DC | PRN
  Administered 2021-07-16: 4 mg via INTRAVENOUS

## 2021-07-16 MED ORDER — AMISULPRIDE (ANTIEMETIC) 5 MG/2ML IV SOLN: 10.0000 mg | Freq: Once | INTRAVENOUS | Status: DC | PRN

## 2021-07-16 MED ORDER — SODIUM CHLORIDE 0.9 % IR SOLN
Status: DC | PRN
  Administered 2021-07-16: 3000 mL via INTRAVESICAL

## 2021-07-16 MED ORDER — TAMSULOSIN HCL 0.4 MG PO CAPS: 0.4000 mg | ORAL_CAPSULE | Freq: Every day | ORAL | 0 refills | Status: DC

## 2021-07-16 MED ORDER — LIDOCAINE 2% (20 MG/ML) 5 ML SYRINGE
INTRAMUSCULAR | Status: DC | PRN
  Administered 2021-07-16: 40 mg via INTRAVENOUS

## 2021-07-16 MED ORDER — 0.9 % SODIUM CHLORIDE (POUR BTL) OPTIME
TOPICAL | Status: DC | PRN
  Administered 2021-07-16: 1000 mL

## 2021-07-16 MED ORDER — FENTANYL CITRATE (PF) 100 MCG/2ML IJ SOLN
INTRAMUSCULAR | Status: DC | PRN
  Administered 2021-07-16: 25 ug via INTRAVENOUS

## 2021-07-16 MED ORDER — KETOROLAC TROMETHAMINE 30 MG/ML IJ SOLN
30.0000 mg | Freq: Once | INTRAMUSCULAR | Status: AC | PRN
  Administered 2021-07-16: 30 mg via INTRAVENOUS

## 2021-07-16 MED ORDER — CHLORHEXIDINE GLUCONATE 0.12 % MT SOLN
15.0000 mL | Freq: Once | OROMUCOSAL | Status: AC
  Administered 2021-07-16: 15 mL via OROMUCOSAL

## 2021-07-16 MED ORDER — FENTANYL CITRATE PF 50 MCG/ML IJ SOSY: 25.0000 ug | PREFILLED_SYRINGE | INTRAMUSCULAR | Status: DC | PRN

## 2021-07-16 MED ORDER — KETOROLAC TROMETHAMINE 30 MG/ML IJ SOLN
INTRAMUSCULAR | Status: AC
  Filled 2021-07-16: qty 1

## 2021-07-16 MED ORDER — CEFAZOLIN SODIUM-DEXTROSE 2-4 GM/100ML-% IV SOLN
2.0000 g | INTRAVENOUS | Status: AC
  Administered 2021-07-16: 2 g via INTRAVENOUS
  Filled 2021-07-16: qty 100

## 2021-07-16 MED ORDER — TRAMADOL HCL 50 MG PO TABS: 50.0000 mg | ORAL_TABLET | Freq: Four times a day (QID) | ORAL | 0 refills | Status: DC | PRN

## 2021-07-16 MED ORDER — LACTATED RINGERS IV SOLN: INTRAVENOUS | Status: DC

## 2021-07-16 MED ORDER — PROPOFOL 10 MG/ML IV BOLUS
INTRAVENOUS | Status: DC | PRN
  Administered 2021-07-16: 120 mg via INTRAVENOUS

## 2021-07-16 MED ORDER — OXYCODONE HCL 5 MG/5ML PO SOLN: 5.0000 mg | Freq: Once | ORAL | Status: AC | PRN

## 2021-07-16 MED ORDER — OXYCODONE HCL 5 MG PO TABS
5.0000 mg | ORAL_TABLET | Freq: Once | ORAL | Status: AC | PRN
  Administered 2021-07-16: 5 mg via ORAL

## 2021-07-16 SURGICAL SUPPLY — 22 items
BAG URO CATCHER STRL LF (MISCELLANEOUS) ×2 IMPLANT
BASKET ZERO TIP NITINOL 2.4FR (BASKET) IMPLANT
BSKT STON RTRVL ZERO TP 2.4FR (BASKET)
CATH URET 5FR 28IN OPEN ENDED (CATHETERS) ×1 IMPLANT
CATH URETL 5X70 OPEN END (CATHETERS) ×1 IMPLANT
CLOTH BEACON ORANGE TIMEOUT ST (SAFETY) ×2 IMPLANT
DRSG TEGADERM 2-3/8X2-3/4 SM (GAUZE/BANDAGES/DRESSINGS) IMPLANT
EXTRACTOR STONE 1.7FRX115CM (UROLOGICAL SUPPLIES) IMPLANT
FIBER LASER MOSES 200 DFL (Laser) ×1 IMPLANT
GLOVE SURG ENC MOIS LTX SZ6.5 (GLOVE) ×3 IMPLANT
GOWN STRL REUS W/TWL LRG LVL3 (GOWN DISPOSABLE) ×3 IMPLANT
GUIDEWIRE STR DUAL SENSOR (WIRE) ×3 IMPLANT
IV NS IRRIG 3000ML ARTHROMATIC (IV SOLUTION) ×1 IMPLANT
KIT TURNOVER KIT A (KITS) ×2 IMPLANT
MANIFOLD NEPTUNE II (INSTRUMENTS) ×2 IMPLANT
NS IRRIG 1000ML POUR BTL (IV SOLUTION) ×1 IMPLANT
PACK CYSTO (CUSTOM PROCEDURE TRAY) ×2 IMPLANT
SHEATH URETERAL 12FRX28CM (UROLOGICAL SUPPLIES) ×1 IMPLANT
SHEATH URETERAL 12FRX35CM (MISCELLANEOUS) IMPLANT
STENT URET 6FRX24 CONTOUR (STENTS) ×1 IMPLANT
TUBING CONNECTING 10 (TUBING) ×2 IMPLANT
TUBING UROLOGY SET (TUBING) ×2 IMPLANT

## 2021-07-16 NOTE — Anesthesia Postprocedure Evaluation (Signed)
Anesthesia Post Note  Patient: Marissa [name]  Procedure(s) Performed: CYSTOSCOPY/URETEROSCOPY/HOLMIUM LASER/STENT PLACEMENT (Right: Renal)     Patient location during evaluation: PACU Anesthesia Type: General Level of consciousness: awake Pain management: pain level controlled Vital Signs Assessment: post-procedure vital signs reviewed and stable Respiratory status: spontaneous breathing, nonlabored ventilation, respiratory function stable and patient connected to nasal cannula oxygen Cardiovascular status: blood pressure returned to baseline and stable Postop Assessment: no apparent nausea or vomiting Anesthetic complications: no   No notable events documented.  Last Vitals:  Vitals:   07/16/21 0945 07/16/21 1003  BP: 114/79 114/79  Pulse: 62 62  Resp: 17 17  Temp: 36.5 C 36.5 C  SpO2: 99% 99%    Last Pain:  Vitals:   07/16/21 1003  TempSrc: Oral  PainSc:                  Manish Ruggiero P Divonte Senger

## 2021-07-16 NOTE — Anesthesia Procedure Notes (Signed)
Procedure Name: LMA Insertion Date/Time: 07/16/2021 7:37 AM Performed by: Eben Burow, CRNA Pre-anesthesia Checklist: Patient identified, Emergency Drugs available, Suction available, Patient being monitored and Timeout performed Patient Marissa [name]-evaluated:Patient Marissa [name]-evaluated prior to induction Oxygen Delivery Method: Circle system utilized Preoxygenation: Pre-oxygenation with 100% oxygen Induction Type: IV induction Ventilation: Mask ventilation without difficulty LMA: LMA inserted LMA Size: 4.0 Number of attempts: 1 Tube secured with: Tape Dental Injury: Teeth and Oropharynx as per pre-operative assessment

## 2021-07-16 NOTE — Transfer of Care (Signed)
Immediate Anesthesia Transfer of Care Note  Patient: Marissa [name]  Procedure(s) Performed: CYSTOSCOPY/URETEROSCOPY/HOLMIUM LASER/STENT PLACEMENT (Right: Renal)  Patient Location: PACU  Anesthesia Type:General  Level of Consciousness: awake, drowsy and patient cooperative  Airway & Oxygen Therapy: Patient Spontanous Breathing and Patient connected to face mask oxygen  Post-op Assessment: Report given to RN and Post -op Vital signs reviewed and stable  Post vital signs: Reviewed and stable  Last Vitals:  Vitals Value Taken Time  BP 121/73 07/16/21 0906  Temp    Pulse 72 07/16/21 0908  Resp 22 07/16/21 0908  SpO2 100 % 07/16/21 0908  Vitals shown include unvalidated device data.  Last Pain:  Vitals:   07/16/21 0542  TempSrc: Oral  PainSc:       Patients Stated Pain Goal: 1 (00/34/96 1164)  Complications: No notable events documented.

## 2021-07-16 NOTE — Discharge Instructions (Addendum)
DISCHARGE INSTRUCTIONS FOR KIDNEY STONE/URETERAL STENT   MEDICATIONS:  1. Resume all your other meds from home  2. AZO over the counter can help with the burning/stinging when you urinate. 3. Tramadol is for moderate/severe pain, otherwise taking up to 1000 mg every 6 hours of plain Tylenol will help treat your pain.   4. Continue cephalexin for infection prevention.  5. Tamsulosin can help with stent discomfort.   ACTIVITY:  1. No strenuous activity x 1week  2. No driving while on narcotic pain medications  3. Drink plenty of water  4. Continue to walk at home - you can still get blood clots when you are at home, so keep active, but don't over do it.  5. May return to work/school tomorrow or when you feel ready   BATHING:  1. You can shower and we recommend daily showers  2. You have a string coming from your urethra: The stent string is attached to your ureteral stent. Do not pull on this.   SIGNS/SYMPTOMS TO CALL:  Please call us if you have a fever greater than 101.5, uncontrolled nausea/vomiting, uncontrolled pain, dizziness, unable to urinate, bloody urine, chest pain, shortness of breath, leg swelling, leg pain, redness around wound, drainage from wound, or any other concerns or questions.   You can reach [place] at [phone].   FOLLOW-UP:  1. You have a string attached to your stent, you may remove it on Friday, October 14. To do this, pull the strings until the stent is completely removed. You may feel an odd sensation in your back.      Work Note 07/16/21 To Whom It May Concern,  Marissa [name] has been under my care since 07/01/21.  She has now undergone two surgical procedures (07/01/21 and 07/16/21).  She has an indwelling ureteral stent in place which causes significant discomfort.  She has been unable to work during this time.  She may return to work on Monday, October 17.  She will require further doctor appointments at [facility].  Sincerely, Jacalyn Lefevre,  MD [phone]

## 2021-07-16 NOTE — Op Note (Signed)
Preoperative diagnosis: right ureteral and renal calculi  Postoperative diagnosis: right renal calculi  Procedure:  Cystoscopy right ureteroscopy, laser lithotripsy, basket stone extraction right 2F x 24 ureteral stent exchange - with tether  Surgeon: Jacalyn Lefevre, MD  Anesthesia: General  Complications: None  Intraoperative findings:  Normal urethra Bilateral orthotropic ureteral orifices Existing ureteral stent exchanged 2Fr x 24 cm stent with tether 1.7 cm stone burden left lower pole Bladder mucosa normal without masses   EBL: Minimal  Specimens: right renal calculus  Disposition of specimens: [facility] for stone analysis  Indication: Marissa [name] is a 55 y.o.   patient with a 1.5 cm right ureteral stone as well as significant right lower pole stone burden and associated right symptoms.  She previously underwent right ureteroscopy with laser lithotripsy and stent placement on 07/01/2021.  She now returns for staged procedure and definitive treatment of lower pole stones.  After reviewing the management options for treatment, the patient elected to proceed with the above surgical procedure(s). We have discussed the potential benefits and risks of the procedure, side effects of the proposed treatment, the likelihood of the patient achieving the goals of the procedure, and any potential problems that might occur during the procedure or recuperation. Informed consent has been obtained.   Description of procedure:  The patient was taken to the operating room and general anesthesia was induced.  The patient was placed in the dorsal lithotomy position, prepped and draped in the usual sterile fashion, and preoperative antibiotics were administered. A preoperative time-out was performed.   Cystourethroscopy was performed.  The patient's urethra was examined and was normal.   The bladder was then systematically examined in its entirety. There was no evidence for any  bladder tumors, stones, or other mucosal pathology.  Graspers were used to bring the existing right ureteral stent to the urethral meatus.  A 0.38 sensor wire was then advanced through the ureteral stent into the kidney with fluoroscopic guidance.  A second wire was placed alongside the first wire in similar manner.  A ureteral access sheath was then placed over the working wire with fluoroscopic guidance.  The inner sheath and wire removed.  Flexible ureteroscopy then took place.  Patient was noted to have a dilated collecting system.  No obvious obstruction was seen.  There were several large stones seen in the lower pole. The stone was then fragmented with the 200 micron holmium laser fiber.  The larger stone fragments were then removed from the ureter with a 0 tip basket.  There is significant amount of dust following stone dusting/fragmentation but all pieces appear to millimeters or less.   The wire was then backloaded through the cystoscope and a ureteral stent was advance over the wire using Seldinger technique.  The stent was positioned appropriately under fluoroscopic and cystoscopic guidance.  The wire was then removed with an adequate stent curl noted in the renal pelvis as well as in the bladder.  The bladder was then emptied and the procedure ended.  The patient appeared to tolerate the procedure well and without complications.  The patient was able to be awakened and transferred to the recovery unit in satisfactory condition.   Disposition: The tether of the stent was left on and tucked inside the patient's vagina.  Instructions for removing the stent have been provided to the patient.

## 2021-07-16 NOTE — Interval H&P Note (Signed)
History and Physical Interval Note: Patient returns to the OR today for staged ureteroscopy.  She previously underwent ureteroscopy of 1.5 cm proximal ureteral stone with stent placement on 07/01/2021.  She has residual stone burden in the right lower pole.  Patient understands she will have a stent afterwards.  07/16/2021 7:24 AM  Marissa [name]  has presented today for surgery, with the diagnosis of RIGHT URETERAL AND RENAL CALCULI.  The various methods of treatment have been discussed with the patient and family. After consideration of risks, benefits and other options for treatment, the patient has consented to  Procedure(s) with comments: CYSTOSCOPY/URETEROSCOPY/HOLMIUM LASER/STENT PLACEMENT (Right) - 90 MINS as a surgical intervention.  The patient's history has been reviewed, patient examined, no change in status, stable for surgery.  I have reviewed the patient's chart and labs.  Questions were answered to the patient's satisfaction.     Atharva Mirsky D Isaiyah Feldhaus

## 2021-07-17 ENCOUNTER — Encounter (HOSPITAL_COMMUNITY): Payer: Self-pay | Admitting: Urology

## 2021-07-26 ENCOUNTER — Encounter: Payer: Self-pay | Admitting: Nurse Practitioner

## 2021-07-26 ENCOUNTER — Ambulatory Visit: Payer: Medicaid Other | Admitting: Nurse Practitioner

## 2021-07-26 ENCOUNTER — Other Ambulatory Visit: Payer: Self-pay

## 2021-07-26 VITALS — BP 118/71 | HR 78 | Temp 98.0°F | Ht 61.0 in | Wt 131.0 lb

## 2021-07-26 DIAGNOSIS — E890 Postprocedural hypothyroidism: Secondary | ICD-10-CM | POA: Diagnosis not present

## 2021-07-26 NOTE — Progress Notes (Signed)
[place], [place]  [address] Phone:  [phone]   Fax:  [phone] Subjective:   Patient ID: Marissa [name], female    DOB: 1965-12-04, 55 y.o.   MRN: [id number]  Chief Complaint  Patient presents with   Follow-up    1 month follow up; no longer having abdominal pain.    HPI Marissa [name] 55 y.o. female   has a past medical history of Cancer ([place]) and History of kidney stones. To the [facility] for 1 mth follow up for abdominal pain and thyroid studies. Patient states that abdominal pain has resolved since last visit. Was successfully evaluated and treated by nephrology.   Patient has been compliant with all medications, including recent change in Synthroid. Denies any other complaints today.   Denies any fever. Denies any fatigue, chest pain, shortness of breath, HA or dizziness. Denies any blurred vision, numbness or tingling.   Past Medical History:  Diagnosis Date   Cancer [facility])    THYROID   CANCER   History of kidney stones     Past Surgical History:  Procedure Laterality Date   CYSTOSCOPY/URETEROSCOPY/HOLMIUM LASER/STENT PLACEMENT Right 07/01/2021   Procedure: CYSTOSCOPY/RETROGRADE/URETEROSCOPY/HOLMIUM LASER/STENT PLACEMENT;  Surgeon: Robley Fries, MD;  Location: [facility];  Service: Urology;  Laterality: Right;   CYSTOSCOPY/URETEROSCOPY/HOLMIUM LASER/STENT PLACEMENT Right 07/16/2021   Procedure: CYSTOSCOPY/URETEROSCOPY/HOLMIUM LASER/STENT PLACEMENT;  Surgeon: Robley Fries, MD;  Location: [facility];  Service: Urology;  Laterality: Right;  90 MINS   THYROIDECTOMY  07/23/2012   Procedure: THYROIDECTOMY;  Surgeon: Izora Gala, MD;  Location: [place];  Service: ENT;  Laterality: Right;  Right thyroid lobectomy possible total    THYROIDECTOMY  09/15/2012   Procedure: THYROIDECTOMY;  Surgeon: Izora Gala, MD;  [facility];  Service: ENT;  Laterality: Left;  COMPLETION OF THYROIDECTOMY    Family History  Family history unknown: Yes     Social History   Socioeconomic History   Marital status: Married    Spouse name: Not on file   Number of children: Not on file   Years of education: Not on file   Highest education level: Not on file  Occupational History   Not on file  Tobacco Use   Smoking status: Never   Smokeless tobacco: Never  Vaping Use   Vaping Use: Never used  Substance and Sexual Activity   Alcohol use: No   Drug use: No   Sexual activity: Yes  Other Topics Concern   Not on file  Social History Narrative   Not on file   Social Determinants of Health   Financial Resource Strain: Not on file  Food Insecurity: Not on file  Transportation Needs: Not on file  Physical Activity: Not on file  Stress: Not on file  Social Connections: Not on file  Intimate Partner Violence: Not on file    Outpatient Medications Prior to Visit  Medication Sig Dispense Refill   acetaminophen (TYLENOL) 500 MG tablet Take 1,000 mg by mouth every 6 (six) hours as needed for moderate pain.     levothyroxine (SYNTHROID) 75 MCG tablet Take 1 tablet (75 mcg total) by mouth daily before breakfast. 30 tablet 0   oxybutynin (DITROPAN) 5 MG tablet Take 1 tablet (5 mg total) by mouth every 8 (eight) hours as needed for bladder spasms. 30 tablet 0   tamsulosin (FLOMAX) 0.4 MG CAPS capsule Take 1 capsule (0.4 mg total) by mouth daily. 15 capsule 0   traMADol (ULTRAM)  50 MG tablet Take 1 tablet (50 mg total) by mouth every 6 (six) hours as needed. 10 tablet 0   traMADol (ULTRAM) 50 MG tablet Take 1 tablet (50 mg total) by mouth every 6 (six) hours as needed. 20 tablet 0   dicyclomine (BENTYL) 10 MG capsule Take 1 capsule (10 mg total) by mouth 3 (three) times daily as needed (abdominal pain). 30 capsule 0   meloxicam (MOBIC) 7.5 MG tablet Take 1 tablet (7.5 mg total) by mouth daily. (Patient not taking: No sig reported) 30 tablet 0   tamsulosin (FLOMAX) 0.4 MG CAPS capsule Take 1 capsule (0.4 mg total) by mouth daily. (Patient not  taking: Reported on 07/05/2021) 30 capsule 0   No facility-administered medications prior to visit.    No Known Allergies  Review of Systems  Constitutional:  Negative for chills, fever and malaise/fatigue.  HENT: Negative.    Eyes: Negative.   Respiratory:  Negative for cough and shortness of breath.   Cardiovascular:  Negative for chest pain, palpitations and leg swelling.  Gastrointestinal:  Negative for abdominal pain, blood in stool, constipation, diarrhea, nausea and vomiting.  Skin: Negative.   Neurological: Negative.   Psychiatric/Behavioral:  Negative for depression. The patient is not nervous/anxious.   All other systems reviewed and are negative.     Objective:    Physical Exam Vitals reviewed.  Constitutional:      General: She is not in acute distress.    Appearance: Normal appearance.  HENT:     Head: Normocephalic.     Right Ear: Tympanic membrane, ear canal and external ear normal.     Left Ear: Tympanic membrane, ear canal and external ear normal.     Nose: Nose normal.     Mouth/Throat:     Mouth: Mucous membranes are moist.     Pharynx: Oropharynx is clear.  Eyes:     Extraocular Movements: Extraocular movements intact.     Conjunctiva/sclera: Conjunctivae normal.     Pupils: Pupils are equal, round, and reactive to light.  Cardiovascular:     Rate and Rhythm: Normal rate and regular rhythm.     Pulses: Normal pulses.     Heart sounds: Normal heart sounds.     Comments: No obvious peripheral edema Pulmonary:     Effort: Pulmonary effort is normal.     Breath sounds: Normal breath sounds.  Abdominal:     General: Abdomen is flat. Bowel sounds are normal.     Palpations: Abdomen is soft.  Musculoskeletal:     Cervical back: Normal range of motion and neck supple.  Skin:    General: Skin is warm and dry.     Capillary Refill: Capillary refill takes less than 2 seconds.  Neurological:     General: No focal deficit present.     Mental Status: She  is alert and oriented to person, place, and time.  Psychiatric:        Mood and Affect: Mood normal.        Behavior: Behavior normal.        Thought Content: Thought content normal.        Judgment: Judgment normal.    BP 118/71   Pulse 78   Temp 98 F (36.7 C)   Ht 5' 1"  (1.549 m)   Wt 131 lb 0.2 oz (59.4 kg)   SpO2 100%   BMI 24.75 kg/m  Wt Readings from Last 3 Encounters:  07/26/21 131 lb 0.2 oz (59.4 kg)  07/16/21 130 lb 15.3 oz (59.4 kg)  07/01/21 130 lb 15.3 oz (59.4 kg)    Immunization History  Administered Date(s) Administered   Influenza Split 07/24/2012   Influenza,inj,Quad PF,6+ Mos 06/18/2021   Tdap 07/25/2019   Unspecified SARS-COV-2 Vaccination 01/04/2020, 01/18/2020    Diabetic Foot Exam - Simple   No data filed     Lab Results  Component Value Date   TSH 0.008 (L) 06/26/2021   Lab Results  Component Value Date   WBC 6.0 06/26/2021   HGB 11.5 06/26/2021   HCT 35.7 06/26/2021   MCV 72 (L) 06/26/2021   PLT 196 06/26/2021   Lab Results  Component Value Date   NA 143 06/26/2021   K 3.7 06/26/2021   CO2 21 06/26/2021   GLUCOSE 97 06/26/2021   BUN 14 06/26/2021   CREATININE 0.70 06/26/2021   BILITOT 0.3 06/26/2021   ALKPHOS 112 06/26/2021   AST 22 06/26/2021   ALT 17 06/26/2021   PROT 7.8 06/26/2021   ALBUMIN 4.8 06/26/2021   CALCIUM 9.3 06/26/2021   ANIONGAP 11 06/07/2019   EGFR 103 06/26/2021   Lab Results  Component Value Date   CHOL 154 06/26/2021   Lab Results  Component Value Date   HDL 44 06/26/2021   Lab Results  Component Value Date   LDLCALC 95 06/26/2021   Lab Results  Component Value Date   TRIG 76 06/26/2021   Lab Results  Component Value Date   CHOLHDL 3.5 06/26/2021   Lab Results  Component Value Date   HGBA1C 5.9 (A) 06/26/2021   HGBA1C 5.9 06/26/2021   HGBA1C 5.9 06/26/2021   HGBA1C 5.9 06/26/2021       Assessment & Plan:   Problem List Items Addressed This Visit   None Visit Diagnoses      History of thyroidectomy    -  Primary   Relevant Orders   TSH+T4F+T3Free Encouraged continued diet and exercise efforts  Encouraged continued compliance with medication     Informed to follow up in 6 wks for lab only appointment and 6 mths for reevaluation of chronic illness(es), sooner as needed.     I have discontinued Exilda Mejorado's meloxicam and dicyclomine. I am also having her maintain her levothyroxine, oxybutynin, acetaminophen, traMADol, and tamsulosin.  No orders of the defined types were placed in this encounter.    Teena Dunk, NP

## 2021-07-26 NOTE — Patient Instructions (Signed)
You were seen today in the [facility] for reevaluation of abdominal pain and thyroid. Labs were collected, results will be available via MyChart or, if abnormal, you will be contacted by clinic staff.  Please follow up in 6 wks for reevaluation of labs.

## 2021-07-27 LAB — TSH+T4F+T3FREE
Free T4: 1.54 ng/dL (ref 0.82–1.77)
T3, Free: 3 pg/mL (ref 2.0–4.4)
TSH: 0.021 u[IU]/mL — ABNORMAL LOW (ref 0.450–4.500)

## 2021-07-29 ENCOUNTER — Other Ambulatory Visit: Payer: Self-pay | Admitting: Nurse Practitioner

## 2021-07-29 DIAGNOSIS — E890 Postprocedural hypothyroidism: Secondary | ICD-10-CM

## 2021-07-29 DIAGNOSIS — Z85850 Personal history of malignant neoplasm of thyroid: Secondary | ICD-10-CM

## 2021-07-29 MED ORDER — LEVOTHYROXINE SODIUM 50 MCG PO TABS: 50.0000 ug | ORAL_TABLET | Freq: Every day | ORAL | 0 refills | Status: DC

## 2021-08-05 ENCOUNTER — Ambulatory Visit (INDEPENDENT_AMBULATORY_CARE_PROVIDER_SITE_OTHER): Payer: Medicaid Other | Admitting: Gastroenterology

## 2021-08-05 ENCOUNTER — Encounter: Payer: Self-pay | Admitting: Gastroenterology

## 2021-08-05 VITALS — BP 100/70 | HR 85 | Ht 61.0 in | Wt 133.0 lb

## 2021-08-05 DIAGNOSIS — R1084 Generalized abdominal pain: Secondary | ICD-10-CM

## 2021-08-05 MED ORDER — OMEPRAZOLE 40 MG PO CPDR: 40.0000 mg | DELAYED_RELEASE_CAPSULE | Freq: Every day | ORAL | 3 refills | Status: DC

## 2021-08-05 NOTE — Progress Notes (Signed)
Referring Provider: Teena Dunk, NP Primary Care Physician:  Bo Merino I, NP   Reason for Consultation: Abdominal pain   IMPRESSION:  Abdominal pain worsened by spicy foods Recurrent kidney stones with persistent abdominal pain despite improvement in kidney stones. CT scan shows no other source for abdominal pain.   PLAN: - Resume omeprazole at 40 mg QAM - EGD with biopsies to evaluate for H pylori, reflux, gastritis, PUD - Screening colonoscopy for colon cancer screening   HPI: Marissa [name] is a 55 y.o. female referred by NP [place] for further evaluation of abdominal pain and diarrhea.  The history is obtained through the patient, her daughter who accompanies her to this appointment and review of her electronic health record.  She has a history of thyroid cancer treated with thyroidectomy and kidney stones. She is from [place] and moved here 15 years ago. Her daughter provides interpretation.   She had several weeks to months of abdominal pain last year. Resolved before it was evaluated.  The pain recurred earlier this year. Described as an diffuse abdominal pain worsened by eating. There is associated diarrhea.  She has some relief with defecation and acetaminophen. No blood or mucous in the stool. No constipation. No heartburn. No dysphagia or odynophagia. Good appetite. Weight stable.   Patient reports she had kidney stones and they were removed and now she is feeling better.  However when she eats spicy foods it make her stomach burn for a little while, onset 2 weeks after the surgery, she had been taking omeprazole starting in Sept and says it was better on omeprazole but she stopped when she ran out.  Denies the use of any NSAIDs.  Labs 06/26/2021 showing normal comprehensive metabolic panel, normal lipase, and a normal CBC.  Except for MCV of 72.  CT of the abdomen and pelvis with contrast 06/27/2021 showed multiple prominent stones within the right renal  collecting system the largest measuring 1.7 x 0.8 cm with associated moderate right hydroureteronephrosis.  There is proximal to mid right ureteral wall thickening.  The GI system is otherwise normal.  Abdominal films obtained in 2021 for abdominal pain showed suspected right renal calculi that of increased in number since the prior study and a nonobstructive bowel gas pattern.   Past Medical History:  Diagnosis Date   Cancer [facility])    THYROID   CANCER   History of kidney stones     Past Surgical History:  Procedure Laterality Date   CYSTOSCOPY/URETEROSCOPY/HOLMIUM LASER/STENT PLACEMENT Right 07/01/2021   Procedure: CYSTOSCOPY/RETROGRADE/URETEROSCOPY/HOLMIUM LASER/STENT PLACEMENT;  Surgeon: Robley Fries, MD;  [facility];  Service: Urology;  Laterality: Right;   CYSTOSCOPY/URETEROSCOPY/HOLMIUM LASER/STENT PLACEMENT Right 07/16/2021   Procedure: CYSTOSCOPY/URETEROSCOPY/HOLMIUM LASER/STENT PLACEMENT;  Surgeon: Robley Fries, MD;  [facility];  Service: Urology;  Laterality: Right;  90 MINS   THYROIDECTOMY  07/23/2012   Procedure: THYROIDECTOMY;  Surgeon: Izora Gala, MD;  Location: [facility] [place];  Service: ENT;  Laterality: Right;  Right thyroid lobectomy possible total    THYROIDECTOMY  09/15/2012   Procedure: THYROIDECTOMY;  Surgeon: Izora Gala, MD;  Location: [place];  Service: ENT;  Laterality: Left;  COMPLETION OF THYROIDECTOMY    Current Outpatient Medications  Medication Sig Dispense Refill   acetaminophen (TYLENOL) 500 MG tablet Take 1,000 mg by mouth every 6 (six) hours as needed for moderate pain.     levothyroxine (SYNTHROID) 50 MCG tablet Take 1 tablet (50 mcg total) by mouth daily before breakfast. 42 tablet 0  oxybutynin (DITROPAN) 5 MG tablet Take 1 tablet (5 mg total) by mouth every 8 (eight) hours as needed for bladder spasms. 30 tablet 0   tamsulosin (FLOMAX) 0.4 MG CAPS capsule Take 1 capsule (0.4 mg total) by mouth daily. 15 capsule 0   traMADol (ULTRAM) 50 MG  tablet Take 1 tablet (50 mg total) by mouth every 6 (six) hours as needed. 10 tablet 0   No current facility-administered medications for this visit.    Allergies as of 08/05/2021   (No Known Allergies)    Family History  Family history unknown: Yes       Review of Systems: 12 system ROS is negative except as noted above.   Physical Exam: General:   Alert,  well-nourished, pleasant and cooperative in NAD Head:  Normocephalic and atraumatic. Eyes:  Sclera clear, no icterus.   Conjunctiva pink. Ears:  Normal auditory acuity. Nose:  No deformity, discharge,  or lesions. Mouth:  No deformity or lesions.   Neck:  Supple; no masses or thyromegaly. Lungs:  Clear throughout to auscultation.   No wheezes. Heart:  Regular rate and rhythm; no murmurs. Abdomen:  Soft,nontender, nondistended, normal bowel sounds, no rebound or guarding. No hepatosplenomegaly.   Rectal:  Deferred  Msk:  Symmetrical. No boney deformities LAD: No inguinal or umbilical LAD Extremities:  No clubbing or edema. Neurologic:  Alert and  oriented x4;  grossly nonfocal Skin:  Intact without significant lesions or rashes. Psych:  Alert and cooperative. Normal mood and affect.     Sakina Briones L. Tarri Glenn, MD, MPH 08/05/2021, 2:09 PM

## 2021-08-05 NOTE — Patient Instructions (Signed)
It was my pleasure to provide care to you today. Based on our discussion, I am providing you with my recommendations below:  RECOMMENDATION(S):   Resume Omeprazole 40 mg daily 30-60 minutes before breakfast.   It has been recommended to you by your physician that you have a(n) endoscopy/colonoscopy completed. Per your request, we did not schedule the procedure(s) today. Please contact our office at [phone] should you decide to have the procedure completed. You will be scheduled for a pre-visit and procedure at that time.   FOLLOW UP:  After your procedure, you will receive a call from my office staff regarding my recommendation for follow up.  BMI:  If you are age 58 or older, your body mass index should be between 23-30. Your Body mass index is 25.13 kg/m. If this is out of the aforementioned range listed, please consider follow up with your Primary Care Provider.  If you are age 6 or younger, your body mass index should be between 19-25. Your Body mass index is 25.13 kg/m. If this is out of the aformentioned range listed, please consider follow up with your Primary Care Provider.   MY CHART:  The [facility] providers would like to encourage you to use [facility] to communicate with providers for non-urgent requests or questions.  Due to long hold times on the telephone, sending your provider a message by [facility] may be a faster and more efficient way to get a response.  Please allow 48 business hours for a response.  Please remember that this is for non-urgent requests.   Thank you for trusting me with your gastrointestinal care!    Thornton Park, MD, MPH

## 2021-08-15 ENCOUNTER — Ambulatory Visit: Payer: BC Managed Care – PPO

## 2021-08-31 ENCOUNTER — Other Ambulatory Visit: Payer: Self-pay | Admitting: Nurse Practitioner

## 2021-08-31 DIAGNOSIS — E890 Postprocedural hypothyroidism: Secondary | ICD-10-CM

## 2021-08-31 DIAGNOSIS — Z85850 Personal history of malignant neoplasm of thyroid: Secondary | ICD-10-CM

## 2021-09-06 ENCOUNTER — Other Ambulatory Visit: Payer: BC Managed Care – PPO

## 2021-09-06 ENCOUNTER — Other Ambulatory Visit: Payer: Self-pay

## 2021-09-06 ENCOUNTER — Other Ambulatory Visit: Payer: Medicaid Other

## 2021-09-06 DIAGNOSIS — E039 Hypothyroidism, unspecified: Secondary | ICD-10-CM

## 2021-09-07 LAB — TSH+T4F+T3FREE
Free T4: 1.01 ng/dL (ref 0.82–1.77)
T3, Free: 2.1 pg/mL (ref 2.0–4.4)
TSH: 5.3 u[IU]/mL — ABNORMAL HIGH (ref 0.450–4.500)

## 2021-09-09 ENCOUNTER — Other Ambulatory Visit: Payer: Self-pay | Admitting: Nurse Practitioner

## 2021-09-09 DIAGNOSIS — Z85850 Personal history of malignant neoplasm of thyroid: Secondary | ICD-10-CM

## 2021-09-09 DIAGNOSIS — E890 Postprocedural hypothyroidism: Secondary | ICD-10-CM

## 2021-09-09 MED ORDER — LEVOTHYROXINE SODIUM 25 MCG PO TABS: 25.0000 ug | ORAL_TABLET | Freq: Every day | ORAL | 0 refills | Status: DC

## 2021-11-04 ENCOUNTER — Other Ambulatory Visit: Payer: Self-pay

## 2021-11-04 ENCOUNTER — Other Ambulatory Visit: Payer: Medicaid Other

## 2021-11-04 DIAGNOSIS — Z85850 Personal history of malignant neoplasm of thyroid: Secondary | ICD-10-CM

## 2021-11-04 DIAGNOSIS — E890 Postprocedural hypothyroidism: Secondary | ICD-10-CM

## 2021-11-05 ENCOUNTER — Other Ambulatory Visit: Payer: Self-pay | Admitting: Nurse Practitioner

## 2021-11-05 DIAGNOSIS — Z85850 Personal history of malignant neoplasm of thyroid: Secondary | ICD-10-CM

## 2021-11-05 DIAGNOSIS — E890 Postprocedural hypothyroidism: Secondary | ICD-10-CM

## 2021-11-05 LAB — TSH+T4F+T3FREE
Free T4: 0.49 ng/dL — ABNORMAL LOW (ref 0.82–1.77)
T3, Free: 1.1 pg/mL — ABNORMAL LOW (ref 2.0–4.4)
TSH: 70.1 u[IU]/mL — ABNORMAL HIGH (ref 0.450–4.500)

## 2021-11-05 MED ORDER — LEVOTHYROXINE SODIUM 25 MCG PO TABS: 50.0000 ug | ORAL_TABLET | Freq: Every day | ORAL | 0 refills | Status: DC

## 2021-12-18 ENCOUNTER — Other Ambulatory Visit: Payer: Self-pay

## 2021-12-18 ENCOUNTER — Other Ambulatory Visit: Payer: Self-pay | Admitting: Nurse Practitioner

## 2021-12-18 ENCOUNTER — Other Ambulatory Visit: Payer: Medicaid Other

## 2021-12-18 DIAGNOSIS — E039 Hypothyroidism, unspecified: Secondary | ICD-10-CM

## 2021-12-19 LAB — TSH+T4F+T3FREE
Free T4: 0.59 ng/dL — ABNORMAL LOW (ref 0.82–1.77)
T3, Free: 1.2 pg/mL — ABNORMAL LOW (ref 2.0–4.4)
TSH: 81.3 u[IU]/mL — ABNORMAL HIGH (ref 0.450–4.500)

## 2021-12-23 ENCOUNTER — Other Ambulatory Visit: Payer: Self-pay | Admitting: Nurse Practitioner

## 2021-12-23 DIAGNOSIS — Z85850 Personal history of malignant neoplasm of thyroid: Secondary | ICD-10-CM

## 2021-12-23 DIAGNOSIS — E890 Postprocedural hypothyroidism: Secondary | ICD-10-CM

## 2021-12-23 MED ORDER — LEVOTHYROXINE SODIUM 88 MCG PO TABS: 88.0000 ug | ORAL_TABLET | Freq: Every day | ORAL | 0 refills | Status: DC

## 2022-05-21 ENCOUNTER — Telehealth: Payer: Self-pay

## 2022-05-21 DIAGNOSIS — E890 Postprocedural hypothyroidism: Secondary | ICD-10-CM

## 2022-05-21 DIAGNOSIS — Z85850 Personal history of malignant neoplasm of thyroid: Secondary | ICD-10-CM

## 2022-05-21 MED ORDER — LEVOTHYROXINE SODIUM 88 MCG PO TABS: 88.0000 ug | ORAL_TABLET | Freq: Every day | ORAL | 0 refills | Status: DC

## 2022-05-21 NOTE — Telephone Encounter (Signed)
Levothyroxine

## 2022-05-21 NOTE — Telephone Encounter (Signed)
done

## 2022-05-21 NOTE — Telephone Encounter (Signed)
No additional notes needed  

## 2022-05-22 ENCOUNTER — Encounter: Payer: Self-pay | Admitting: Nurse Practitioner

## 2022-05-22 ENCOUNTER — Ambulatory Visit: Payer: Medicaid Other | Admitting: Nurse Practitioner

## 2022-05-22 DIAGNOSIS — Z85850 Personal history of malignant neoplasm of thyroid: Secondary | ICD-10-CM

## 2022-05-22 DIAGNOSIS — Z9889 Other specified postprocedural states: Secondary | ICD-10-CM

## 2022-05-22 DIAGNOSIS — E890 Postprocedural hypothyroidism: Secondary | ICD-10-CM | POA: Diagnosis not present

## 2022-05-22 MED ORDER — LEVOTHYROXINE SODIUM 88 MCG PO TABS: 88.0000 ug | ORAL_TABLET | Freq: Every day | ORAL | 0 refills | Status: DC

## 2022-05-22 NOTE — Patient Instructions (Addendum)
1. History of thyroidectomy  - levothyroxine (SYNTHROID) 88 MCG tablet; Take 1 tablet (88 mcg total) by mouth daily before breakfast.  Dispense: 90 tablet; Refill: 0 - Thyroid Panel With TSH - Lipid Panel - CBC - Comprehensive metabolic panel  2. History of thyroid cancer  - levothyroxine (SYNTHROID) 88 MCG tablet; Take 1 tablet (88 mcg total) by mouth daily before breakfast.  Dispense: 90 tablet; Refill: 0 - Thyroid Panel With TSH - Lipid Panel - CBC - Comprehensive metabolic panel  AVS was discussed with patient. Patient was asked if there were any other questions or concerns today. Patient stated there were no other questions or concerns. Patient was advised that they needed lab work and then could check out and schedule follow up visit. Patient voiced understanding. Appointment was completed.    Follow up:  Follow up in 6 months or sooner if needed

## 2022-05-22 NOTE — Progress Notes (Signed)
$'@Patient'N$  ID: Marissa [name], female    DOB: [birth year], 56 y.o.   MRN: [id number]  Chief Complaint  Patient presents with   Follow-up    Pt is here for follow up visit     Referring provider: Bo Merino I, NP   HPI  Marissa [name] 56 y.o. female   has a past medical history of thyroid cancer ([place]) and History of kidney stones.  Patient is a former patient of [employer]. She was being treated for thyroid disorder. It appears that last TSH was 81. Patient's dose of synthroid was increased in March. Patient did not return for follow up as advised. We will recheck levels today. I am sending in a refill on synthroid, but advised patient that most likely her medication will need adjustment. Patient states that she has been doing well with no new issues or concerns. Denies f/c/s, n/v/d, hemoptysis, PND, leg swelling Denies chest pain or edema     No Known Allergies  Immunization History  Administered Date(s) Administered   Influenza Split 07/24/2012   Influenza,inj,Quad PF,6+ Mos 06/18/2021   Tdap 07/25/2019   Unspecified SARS-COV-2 Vaccination 01/04/2020, 01/18/2020    Past Medical History:  Diagnosis Date   Cancer ([place])    THYROID   CANCER   History of kidney stones     Tobacco History: Social History   Tobacco Use  Smoking Status Never  Smokeless Tobacco Never   Counseling given: Not Answered   Outpatient Encounter Medications as of 05/22/2022  Medication Sig   acetaminophen (TYLENOL) 500 MG tablet Take 1,000 mg by mouth every 6 (six) hours as needed for moderate pain.   [DISCONTINUED] levothyroxine (SYNTHROID) 88 MCG tablet Take 1 tablet (88 mcg total) by mouth daily before breakfast.   levothyroxine (SYNTHROID) 88 MCG tablet Take 1 tablet (88 mcg total) by mouth daily before breakfast.   omeprazole (PRILOSEC) 40 MG capsule Take 1 capsule (40 mg total) by mouth daily. (Patient not taking: Reported on 05/22/2022)   [DISCONTINUED] Cetirizine HCl 10 MG CAPS Take 1  capsule (10 mg total) by mouth daily.   [DISCONTINUED] gabapentin (NEURONTIN) 300 MG capsule 1 tab po at bedtime 1st day, 1 tablet bid second day, then 1 tablet tid   No facility-administered encounter medications on file as of 05/22/2022.     Review of Systems  Review of Systems  Constitutional: Negative.   HENT: Negative.    Cardiovascular: Negative.   Gastrointestinal: Negative.   Allergic/Immunologic: Negative.   Neurological: Negative.   Psychiatric/Behavioral: Negative.         Physical Exam  BP 109/70 (BP Location: Left Arm, Patient Position: Sitting, Cuff Size: Normal)   Pulse 75   Temp 97.6 F (36.4 C)   Ht '5\' 1"'$  (1.549 m)   Wt 142 lb (64.4 kg)   SpO2 99%   BMI 26.83 kg/m   Wt Readings from Last 5 Encounters:  05/22/22 142 lb (64.4 kg)  08/05/21 133 lb (60.3 kg)  07/26/21 131 lb 0.2 oz (59.4 kg)  07/16/21 130 lb 15.3 oz (59.4 kg)  07/01/21 130 lb 15.3 oz (59.4 kg)     Physical Exam Vitals and nursing note reviewed.  Constitutional:      General: She is not in acute distress.    Appearance: She is well-developed.  Cardiovascular:     Rate and Rhythm: Normal rate and regular rhythm.  Pulmonary:     Effort: Pulmonary effort is normal.     Breath sounds: Normal breath sounds.  Neurological:     Mental Status: She is alert and oriented to person, place, and time.      Lab Results:  CBC    Component Value Date/Time   WBC 6.0 06/26/2021 0954   WBC 5.1 06/07/2019 1420   RBC 4.93 06/26/2021 0954   RBC 5.11 06/07/2019 1420   HGB 11.5 06/26/2021 0954   HCT 35.7 06/26/2021 0954   PLT 196 06/26/2021 0954   MCV 72 (L) 06/26/2021 0954   MCH 23.3 (L) 06/26/2021 0954   MCH 23.5 (L) 06/07/2019 1420   MCHC 32.2 06/26/2021 0954   MCHC 30.8 06/07/2019 1420   RDW 14.9 06/26/2021 0954   LYMPHSABS 1.2 06/26/2021 0954   MONOABS 0.1 02/18/2013 1047   EOSABS 0.1 06/26/2021 0954   BASOSABS 0.0 06/26/2021 0954    BMET    Component Value Date/Time   NA  143 06/26/2021 0954   K 3.7 06/26/2021 0954   CL 106 06/26/2021 0954   CO2 21 06/26/2021 0954   GLUCOSE 97 06/26/2021 0954   GLUCOSE 97 06/07/2019 1420   BUN 14 06/26/2021 0954   CREATININE 0.70 06/26/2021 0954   CALCIUM 9.3 06/26/2021 0954   GFRNONAA >60 06/07/2019 1420   GFRAA >60 06/07/2019 1420    BNP No results found for: "BNP"  ProBNP No results found for: "PROBNP"  Imaging: No results found.   Assessment & Plan:   History of thyroidectomy - levothyroxine (SYNTHROID) 88 MCG tablet; Take 1 tablet (88 mcg total) by mouth daily before breakfast.  Dispense: 90 tablet; Refill: 0 - Thyroid Panel With TSH - Lipid Panel - CBC - Comprehensive metabolic panel  2. History of thyroid cancer  - levothyroxine (SYNTHROID) 88 MCG tablet; Take 1 tablet (88 mcg total) by mouth daily before breakfast.  Dispense: 90 tablet; Refill: 0 - Thyroid Panel With TSH - Lipid Panel - CBC - Comprehensive metabolic panel  AVS was discussed with patient. Patient was asked if there were any other questions or concerns today. Patient stated there were no other questions or concerns. Patient was advised that they needed lab work and then could check out and schedule follow up visit. Patient voiced understanding. Appointment was completed.    Follow up:  Follow up in 6 months or sooner if needed     Fenton Foy, NP 05/22/2022

## 2022-05-22 NOTE — Assessment & Plan Note (Signed)
-   levothyroxine (SYNTHROID) 88 MCG tablet; Take 1 tablet (88 mcg total) by mouth daily before breakfast.  Dispense: 90 tablet; Refill: 0 - Thyroid Panel With TSH - Lipid Panel - CBC - Comprehensive metabolic panel  2. History of thyroid cancer  - levothyroxine (SYNTHROID) 88 MCG tablet; Take 1 tablet (88 mcg total) by mouth daily before breakfast.  Dispense: 90 tablet; Refill: 0 - Thyroid Panel With TSH - Lipid Panel - CBC - Comprehensive metabolic panel  AVS was discussed with patient. Patient was asked if there were any other questions or concerns today. Patient stated there were no other questions or concerns. Patient was advised that they needed lab work and then could check out and schedule follow up visit. Patient voiced understanding. Appointment was completed.    Follow up:  Follow up in 6 months or sooner if needed

## 2022-05-23 LAB — CBC
Hematocrit: 38 % (ref 34.0–46.6)
Hemoglobin: 11.7 g/dL (ref 11.1–15.9)
MCH: 23.7 pg — ABNORMAL LOW (ref 26.6–33.0)
MCHC: 30.8 g/dL — ABNORMAL LOW (ref 31.5–35.7)
MCV: 77 fL — ABNORMAL LOW (ref 79–97)
Platelets: 188 10*3/uL (ref 150–450)
RBC: 4.93 x10E6/uL (ref 3.77–5.28)
RDW: 13.9 % (ref 11.7–15.4)
WBC: 3.7 10*3/uL (ref 3.4–10.8)

## 2022-05-23 LAB — COMPREHENSIVE METABOLIC PANEL
ALT: 30 IU/L (ref 0–32)
AST: 29 IU/L (ref 0–40)
Albumin/Globulin Ratio: 1.8 (ref 1.2–2.2)
Albumin: 4.6 g/dL (ref 3.8–4.9)
Alkaline Phosphatase: 92 IU/L (ref 44–121)
BUN/Creatinine Ratio: 23 (ref 9–23)
BUN: 15 mg/dL (ref 6–24)
Bilirubin Total: 0.3 mg/dL (ref 0.0–1.2)
CO2: 21 mmol/L (ref 20–29)
Calcium: 9 mg/dL (ref 8.7–10.2)
Chloride: 105 mmol/L (ref 96–106)
Creatinine, Ser: 0.66 mg/dL (ref 0.57–1.00)
Globulin, Total: 2.6 g/dL (ref 1.5–4.5)
Glucose: 72 mg/dL (ref 70–99)
Potassium: 3.9 mmol/L (ref 3.5–5.2)
Sodium: 142 mmol/L (ref 134–144)
Total Protein: 7.2 g/dL (ref 6.0–8.5)
eGFR: 104 mL/min/{1.73_m2} (ref >59)

## 2022-05-23 LAB — LIPID PANEL
Chol/HDL Ratio: 3.6 ratio (ref 0.0–4.4)
Cholesterol, Total: 166 mg/dL (ref 100–199)
HDL: 46 mg/dL (ref >39)
LDL Chol Calc (NIH): 105 mg/dL — ABNORMAL HIGH (ref 0–99)
Triglycerides: 82 mg/dL (ref 0–149)
VLDL Cholesterol Cal: 15 mg/dL (ref 5–40)

## 2022-05-23 LAB — THYROID PANEL WITH TSH
Free Thyroxine Index: 2.6 (ref 1.2–4.9)
T3 Uptake Ratio: 33 % (ref 24–39)
T4, Total: 8 ug/dL (ref 4.5–12.0)
TSH: 0.438 u[IU]/mL — ABNORMAL LOW (ref 0.450–4.500)

## 2022-05-28 ENCOUNTER — Other Ambulatory Visit: Payer: Self-pay | Admitting: Nurse Practitioner

## 2022-05-28 ENCOUNTER — Telehealth: Payer: Self-pay

## 2022-05-28 DIAGNOSIS — E890 Postprocedural hypothyroidism: Secondary | ICD-10-CM

## 2022-05-28 DIAGNOSIS — Z85850 Personal history of malignant neoplasm of thyroid: Secondary | ICD-10-CM

## 2022-05-28 MED ORDER — LEVOTHYROXINE SODIUM 88 MCG PO TABS: 88.0000 ug | ORAL_TABLET | Freq: Every day | ORAL | 0 refills | Status: DC

## 2022-05-28 NOTE — Telephone Encounter (Signed)
Levothyroxine

## 2022-08-22 ENCOUNTER — Telehealth: Payer: Self-pay | Admitting: Nurse Practitioner

## 2022-08-22 ENCOUNTER — Other Ambulatory Visit: Payer: Self-pay

## 2022-08-22 DIAGNOSIS — Z85850 Personal history of malignant neoplasm of thyroid: Secondary | ICD-10-CM

## 2022-08-22 DIAGNOSIS — E890 Postprocedural hypothyroidism: Secondary | ICD-10-CM

## 2022-08-22 MED ORDER — LEVOTHYROXINE SODIUM 88 MCG PO TABS: 88.0000 ug | ORAL_TABLET | Freq: Every day | ORAL | 0 refills | Status: DC

## 2022-08-22 NOTE — Telephone Encounter (Signed)
Caller & Relationship to patient:  MRN #  [id number]   Call Back Number:   Date of Last Office Visit: 08/22/2022     Date of Next Office Visit: 08/25/2022    Medication(s) to be Refilled: Levothyroxine  Preferred Pharmacy:   ** Please notify patient to allow 48-72 hours to process** **Let patient know to contact pharmacy at the end of the day to make sure medication is ready. ** **If patient has not been seen in a year or longer, book an appointment **Advise to use MyChart for refill requests OR to contact their pharmacy

## 2022-08-25 ENCOUNTER — Ambulatory Visit: Payer: Medicaid Other | Admitting: Nurse Practitioner

## 2022-08-25 ENCOUNTER — Encounter: Payer: Self-pay | Admitting: Nurse Practitioner

## 2022-08-25 VITALS — BP 113/70 | HR 80 | Temp 98.2°F | Ht 61.0 in | Wt 146.0 lb

## 2022-08-25 DIAGNOSIS — E039 Hypothyroidism, unspecified: Secondary | ICD-10-CM | POA: Diagnosis not present

## 2022-08-25 NOTE — Patient Instructions (Signed)
1. Hypothyroidism (acquired)  - Thyroid Panel With TSH  Follow up:  Follow up in 3 months  Fat and Cholesterol Restricted Eating Plan General tips Work with your doctor to lose weight if you need to. Avoid: Foods with added sugar. Fried foods. Foods with trans fat or partially hydrogenated oils. This includes some margarines and baked goods. If you drink alcohol: Limit how much you have to: 0-1 drink a day for women who are not pregnant. 0-2 drinks a day for men. Know how much alcohol is in a drink. In the [place] one drink equals one 12 oz bottle of beer (355 mL), one 5 oz glass of wine (148 mL), or one 1 oz glass of hard liquor (44 mL). Reading food labels Check food labels for: Trans fats. Partially hydrogenated oils. Saturated fat (g) in each serving. Cholesterol (mg) in each serving. Fiber (g) in each serving. Choose foods with healthy fats, such as: Monounsaturated fats and polyunsaturated fats. These include olive and canola oil, flaxseeds, walnuts, almonds, and seeds. Omega-3 fats. These are found in certain fish, flaxseed oil, and ground flaxseeds. Choose grain products that have whole grains. Look for the word "whole" as the first word in the ingredient list. Cooking Cook foods using low-fat methods. These include baking, boiling, grilling, and broiling. Eat more home-cooked foods. Eat at restaurants and buffets less often. Eat less fast food. Avoid cooking using saturated fats, such as butter, cream, palm oil, palm kernel oil, and coconut oil. Meal planning  At meals, divide your plate into four equal parts: Fill one-half of your plate with vegetables, green salads, and fruit. Fill one-fourth of your plate with whole grains. Fill one-fourth of your plate with low-fat (lean) protein foods. Eat fish that is high in omega-3 fats at least two times a week. This includes mackerel, tuna, sardines, and salmon. Eat foods that are high in fiber, such as whole grains, beans,  apples, pears, berries, broccoli, carrots, peas, and barley. What foods should I eat? Fruits All fresh, canned (in natural juice), or frozen fruits. Vegetables Fresh or frozen vegetables (raw, steamed, roasted, or grilled). Green salads. Grains Whole grains, such as whole wheat or whole grain breads, crackers, cereals, and pasta. Unsweetened oatmeal, bulgur, barley, quinoa, or brown rice. Corn or whole wheat flour tortillas. Meats and other protein foods Ground beef (85% or leaner), grass-fed beef, or beef trimmed of fat. Skinless chicken or [place]. Ground chicken or [place]. Pork trimmed of fat. All fish and seafood. Egg whites. Dried beans, peas, or lentils. Unsalted nuts or seeds. Unsalted canned beans. Nut butters without added sugar or oil. Dairy Low-fat or nonfat dairy products, such as skim or 1% milk, 2% or reduced-fat cheeses, low-fat and fat-free ricotta or cottage cheese, or plain low-fat and nonfat yogurt. Fats and oils Tub margarine without trans fats. Light or reduced-fat mayonnaise and salad dressings. Avocado. Olive, canola, sesame, or safflower oils. The items listed above may not be a complete list of foods and beverages you can eat. Contact a dietitian for more information. What foods should I avoid? Fruits Canned fruit in heavy syrup. Fruit in cream or butter sauce. Fried fruit. Vegetables Vegetables cooked in cheese, cream, or butter sauce. Fried vegetables. Grains White bread. White pasta. White rice. Cornbread. Bagels, pastries, and croissants. Crackers and snack foods that contain trans fat and hydrogenated oils. Meats and other protein foods Fatty cuts of meat. Ribs, chicken wings, bacon, sausage, bologna, salami, chitterlings, fatback, hot dogs, bratwurst, and packaged lunch meats.  Liver and organ meats. Whole eggs and egg yolks. Chicken and [place] with skin. Fried meat. Dairy Whole or 2% milk, cream, half-and-half, and cream cheese. Whole milk cheeses. Whole-fat or  sweetened yogurt. Full-fat cheeses. Nondairy creamers and whipped toppings. Processed cheese, cheese spreads, and cheese curds. Fats and oils Butter, stick margarine, lard, shortening, ghee, or bacon fat. Coconut, palm kernel, and palm oils. Beverages Alcohol. Sugar-sweetened drinks such as sodas, lemonade, and fruit drinks. Sweets and desserts Corn syrup, sugars, honey, and molasses. Candy. Jam and jelly. Syrup. Sweetened cereals. Cookies, pies, cakes, donuts, muffins, and ice cream. The items listed above may not be a complete list of foods and beverages you should avoid. Contact a dietitian for more information. Summary Choosing the right foods helps keep your fat and cholesterol at normal levels. This can keep you from getting certain diseases. At meals, fill one-half of your plate with vegetables, green salads, and fruits. Eat high fiber foods, like whole grains, beans, apples, pears, berries, carrots, peas, and barley. Limit added sugar, saturated fats, alcohol, and fried foods. This information is not intended to replace advice given to you by your health care provider. Make sure you discuss any questions you have with your health care provider. Document Revised: 02/01/2021 Document Reviewed: 02/01/2021 Elsevier Patient Education  [place].  Preventing High Cholesterol Cholesterol is a white, waxy substance similar to fat that the human body needs to help build cells. The liver makes all the cholesterol that a person's body needs. Having high cholesterol (hypercholesterolemia) increases your risk for heart disease and stroke. Extra or excess cholesterol comes from the food that you eat. High cholesterol can often be prevented with diet and lifestyle changes. If you already have high cholesterol, you can control it with diet, lifestyle changes, and medicines. How can high cholesterol affect me? If you have high cholesterol, fatty deposits (plaques) may build up on the walls of  your blood vessels. The blood vessels that carry blood away from your heart are called arteries. Plaques make the arteries narrower and stiffer. This in turn can: Restrict or block blood flow and cause blood clots to form. Increase your risk for heart attack and stroke. What can increase my risk for high cholesterol? This condition is more likely to develop in people who: Eat foods that are high in saturated fat or cholesterol. Saturated fat is mostly found in foods that come from animal sources. Are overweight. Are not getting enough exercise. Use products that contain nicotine or tobacco, such as cigarettes, e-cigarettes, and chewing tobacco. Have a family history of high cholesterol (familial hypercholesterolemia). What actions can I take to prevent this? Nutrition  Eat less saturated fat. Avoid trans fats (partially hydrogenated oils). These are often found in margarine and in some baked goods, fried foods, and snacks bought in packages. Avoid precooked or cured meat, such as bacon, sausages, or meat loaves. Avoid foods and drinks that have added sugars. Eat more fruits, vegetables, and whole grains. Choose healthy sources of protein, such as fish, poultry, lean cuts of red meat, beans, peas, lentils, and nuts. Choose healthy sources of fat, such as: Nuts. Vegetable oils, especially olive oil. Fish that have healthy fats, such as omega-3 fatty acids. These fish include mackerel or salmon. Lifestyle Lose weight if you are overweight. Maintaining a healthy body mass index (BMI) can help prevent or control high cholesterol. It can also lower your risk for diabetes and high blood pressure. Ask your health care provider to help you  with a diet and exercise plan to lose weight safely. Do not use any products that contain nicotine or tobacco. These products include cigarettes, chewing tobacco, and vaping devices, such as e-cigarettes. If you need help quitting, ask your health care  provider. Alcohol use Do not drink alcohol if: Your health care provider tells you not to drink. You are pregnant, may be pregnant, or are planning to become pregnant. If you drink alcohol: Limit how much you have to: 0-1 drink a day for women. 0-2 drinks a day for men. Know how much alcohol is in your drink. In the [place] one drink equals one 12 oz bottle of beer (355 mL), one 5 oz glass of wine (148 mL), or one 1 oz glass of hard liquor (44 mL). Activity  Get enough exercise. Do exercises as told by your health care provider. Each week, do at least 150 minutes of exercise that takes a medium level of effort (moderate-intensity exercise). This kind of exercise: Makes your heart beat faster while allowing you to still be able to talk. Can be done in short sessions several times a day or longer sessions a few times a week. For example, on 5 days each week, you could walk fast or ride your bike 3 times a day for 10 minutes each time. Medicines Your health care provider may recommend medicines to help lower cholesterol. This may be a medicine to lower the amount of cholesterol that your liver makes. You may need medicine if: Diet and lifestyle changes have not lowered your cholesterol enough. You have high cholesterol and other risk factors for heart disease or stroke. Take over-the-counter and prescription medicines only as told by your health care provider. General information Manage your risk factors for high cholesterol. Talk with your health care provider about all your risk factors and how to lower your risk. Manage other conditions that you have, such as diabetes or high blood pressure (hypertension). Have blood tests to check your cholesterol levels at regular points in time as told by your health care provider. Keep all follow-up visits. This is important. Where to find more information American Heart Association: www.heart.org National Heart, Lung, and Blood Institute:  https://wilson-eaton.com/ Summary High cholesterol increases your risk for heart disease and stroke. By keeping your cholesterol level low, you can reduce your risk for these conditions. High cholesterol can often be prevented with diet and lifestyle changes. Work with your health care provider to manage your risk factors, and have your blood tested regularly. This information is not intended to replace advice given to you by your health care provider. Make sure you discuss any questions you have with your health care provider. Document Revised: 04/25/2022 Document Reviewed: 11/26/2020 Elsevier Patient Education  [place].

## 2022-08-25 NOTE — Progress Notes (Signed)
$'@Patient'E$  ID: Marissa [name], female    DOB: 1966-03-29, 56 y.o.   MRN: [id number]  Chief Complaint  Patient presents with   Follow-up   Back Pain    Pt injured-fell in the bathroom Nov 2--lower back pain.     Referring provider: Fenton Foy, NP   HPI  Marissa [name] 56 y.o. female   has a past medical history of thyroid cancer ([place]) and History of kidney stones.   Patient presents today for follow-up on thyroid.  Overall she states that she has been doing well.  She did fall a few weeks ago but states that she is much improved.  Overall no issues or concerns today.  We will recheck thyroid panel today.  We did discuss that her cholesterol was slightly abnormal at last visit and we will print out some educational material with AVS. Denies f/c/s, n/v/d, hemoptysis, PND, leg swelling Denies chest pain or edema     No Known Allergies  Immunization History  Administered Date(s) Administered   Influenza Split 07/24/2012   Influenza,inj,Quad PF,6+ Mos 06/18/2021   Tdap 07/25/2019   Unspecified SARS-COV-2 Vaccination 01/04/2020, 01/18/2020    Past Medical History:  Diagnosis Date   Cancer ([place])    THYROID   CANCER   History of kidney stones     Tobacco History: Social History   Tobacco Use  Smoking Status Never  Smokeless Tobacco Never   Counseling given: Not Answered   Outpatient Encounter Medications as of 08/25/2022  Medication Sig   acetaminophen (TYLENOL) 500 MG tablet Take 1,000 mg by mouth every 6 (six) hours as needed for moderate pain.   levothyroxine (SYNTHROID) 88 MCG tablet Take 1 tablet (88 mcg total) by mouth daily before breakfast.   [DISCONTINUED] Cetirizine HCl 10 MG CAPS Take 1 capsule (10 mg total) by mouth daily.   [DISCONTINUED] gabapentin (NEURONTIN) 300 MG capsule 1 tab po at bedtime 1st day, 1 tablet bid second day, then 1 tablet tid   [DISCONTINUED] omeprazole (PRILOSEC) 40 MG capsule Take 1 capsule (40 mg total) by mouth daily. (Patient not  taking: Reported on 05/22/2022)   No facility-administered encounter medications on file as of 08/25/2022.     Review of Systems  Review of Systems  Constitutional: Negative.   HENT: Negative.    Cardiovascular: Negative.   Gastrointestinal: Negative.   Allergic/Immunologic: Negative.   Neurological: Negative.   Psychiatric/Behavioral: Negative.         Physical Exam  BP 113/70   Pulse 80   Temp 98.2 F (36.8 C)   Ht '5\' 1"'$  (1.549 m)   Wt 146 lb (66.2 kg)   SpO2 99%   BMI 27.59 kg/m   Wt Readings from Last 5 Encounters:  08/25/22 146 lb (66.2 kg)  05/22/22 142 lb (64.4 kg)  08/05/21 133 lb (60.3 kg)  07/26/21 131 lb 0.2 oz (59.4 kg)  07/16/21 130 lb 15.3 oz (59.4 kg)     Physical Exam Vitals and nursing note reviewed.  Constitutional:      General: She is not in acute distress.    Appearance: She is well-developed.  Cardiovascular:     Rate and Rhythm: Normal rate and regular rhythm.  Pulmonary:     Effort: Pulmonary effort is normal.     Breath sounds: Normal breath sounds.  Neurological:     Mental Status: She is alert and oriented to person, place, and time.      Lab Results:  CBC    Component  Value Date/Time   WBC 3.7 05/22/2022 1034   WBC 5.1 06/07/2019 1420   RBC 4.93 05/22/2022 1034   RBC 5.11 06/07/2019 1420   HGB 11.7 05/22/2022 1034   HCT 38.0 05/22/2022 1034   PLT 188 05/22/2022 1034   MCV 77 (L) 05/22/2022 1034   MCH 23.7 (L) 05/22/2022 1034   MCH 23.5 (L) 06/07/2019 1420   MCHC 30.8 (L) 05/22/2022 1034   MCHC 30.8 06/07/2019 1420   RDW 13.9 05/22/2022 1034   LYMPHSABS 1.2 06/26/2021 0954   MONOABS 0.1 02/18/2013 1047   EOSABS 0.1 06/26/2021 0954   BASOSABS 0.0 06/26/2021 0954    BMET    Component Value Date/Time   NA 142 05/22/2022 1034   K 3.9 05/22/2022 1034   CL 105 05/22/2022 1034   CO2 21 05/22/2022 1034   GLUCOSE 72 05/22/2022 1034   GLUCOSE 97 06/07/2019 1420   BUN 15 05/22/2022 1034   CREATININE 0.66  05/22/2022 1034   CALCIUM 9.0 05/22/2022 1034   GFRNONAA >60 06/07/2019 1420   GFRAA >60 06/07/2019 1420     Assessment & Plan:   Hypothyroidism (acquired) - Thyroid Panel With TSH  Follow up:  Follow up in 3 months   Patient Instructions  1. Hypothyroidism (acquired)  - Thyroid Panel With TSH  Follow up:  Follow up in 3 months  Fat and Cholesterol Restricted Eating Plan General tips Work with your doctor to lose weight if you need to. Avoid: Foods with added sugar. Fried foods. Foods with trans fat or partially hydrogenated oils. This includes some margarines and baked goods. If you drink alcohol: Limit how much you have to: 0-1 drink a day for women who are not pregnant. 0-2 drinks a day for men. Know how much alcohol is in a drink. In the [place] one drink equals one 12 oz bottle of beer (355 mL), one 5 oz glass of wine (148 mL), or one 1 oz glass of hard liquor (44 mL). Reading food labels Check food labels for: Trans fats. Partially hydrogenated oils. Saturated fat (g) in each serving. Cholesterol (mg) in each serving. Fiber (g) in each serving. Choose foods with healthy fats, such as: Monounsaturated fats and polyunsaturated fats. These include olive and canola oil, flaxseeds, walnuts, almonds, and seeds. Omega-3 fats. These are found in certain fish, flaxseed oil, and ground flaxseeds. Choose grain products that have whole grains. Look for the word "whole" as the first word in the ingredient list. Cooking Cook foods using low-fat methods. These include baking, boiling, grilling, and broiling. Eat more home-cooked foods. Eat at restaurants and buffets less often. Eat less fast food. Avoid cooking using saturated fats, such as butter, cream, palm oil, palm kernel oil, and coconut oil. Meal planning  At meals, divide your plate into four equal parts: Fill one-half of your plate with vegetables, green salads, and fruit. Fill one-fourth of your plate with  whole grains. Fill one-fourth of your plate with low-fat (lean) protein foods. Eat fish that is high in omega-3 fats at least two times a week. This includes mackerel, tuna, sardines, and salmon. Eat foods that are high in fiber, such as whole grains, beans, apples, pears, berries, broccoli, carrots, peas, and barley. What foods should I eat? Fruits All fresh, canned (in natural juice), or frozen fruits. Vegetables Fresh or frozen vegetables (raw, steamed, roasted, or grilled). Green salads. Grains Whole grains, such as whole wheat or whole grain breads, crackers, cereals, and pasta. Unsweetened oatmeal, bulgur, barley, quinoa,  or brown rice. Corn or whole wheat flour tortillas. Meats and other protein foods Ground beef (85% or leaner), grass-fed beef, or beef trimmed of fat. Skinless chicken or [place]. Ground chicken or [place]. Pork trimmed of fat. All fish and seafood. Egg whites. Dried beans, peas, or lentils. Unsalted nuts or seeds. Unsalted canned beans. Nut butters without added sugar or oil. Dairy Low-fat or nonfat dairy products, such as skim or 1% milk, 2% or reduced-fat cheeses, low-fat and fat-free ricotta or cottage cheese, or plain low-fat and nonfat yogurt. Fats and oils Tub margarine without trans fats. Light or reduced-fat mayonnaise and salad dressings. Avocado. Olive, canola, sesame, or safflower oils. The items listed above may not be a complete list of foods and beverages you can eat. Contact a dietitian for more information. What foods should I avoid? Fruits Canned fruit in heavy syrup. Fruit in cream or butter sauce. Fried fruit. Vegetables Vegetables cooked in cheese, cream, or butter sauce. Fried vegetables. Grains White bread. White pasta. White rice. Cornbread. Bagels, pastries, and croissants. Crackers and snack foods that contain trans fat and hydrogenated oils. Meats and other protein foods Fatty cuts of meat. Ribs, chicken wings, bacon, sausage, bologna,  salami, chitterlings, fatback, hot dogs, bratwurst, and packaged lunch meats. Liver and organ meats. Whole eggs and egg yolks. Chicken and [place] with skin. Fried meat. Dairy Whole or 2% milk, cream, half-and-half, and cream cheese. Whole milk cheeses. Whole-fat or sweetened yogurt. Full-fat cheeses. Nondairy creamers and whipped toppings. Processed cheese, cheese spreads, and cheese curds. Fats and oils Butter, stick margarine, lard, shortening, ghee, or bacon fat. Coconut, palm kernel, and palm oils. Beverages Alcohol. Sugar-sweetened drinks such as sodas, lemonade, and fruit drinks. Sweets and desserts Corn syrup, sugars, honey, and molasses. Candy. Jam and jelly. Syrup. Sweetened cereals. Cookies, pies, cakes, donuts, muffins, and ice cream. The items listed above may not be a complete list of foods and beverages you should avoid. Contact a dietitian for more information. Summary Choosing the right foods helps keep your fat and cholesterol at normal levels. This can keep you from getting certain diseases. At meals, fill one-half of your plate with vegetables, green salads, and fruits. Eat high fiber foods, like whole grains, beans, apples, pears, berries, carrots, peas, and barley. Limit added sugar, saturated fats, alcohol, and fried foods. This information is not intended to replace advice given to you by your health care provider. Make sure you discuss any questions you have with your health care provider. Document Revised: 02/01/2021 Document Reviewed: 02/01/2021 Elsevier Patient Education  [place].  Preventing High Cholesterol Cholesterol is a white, waxy substance similar to fat that the human body needs to help build cells. The liver makes all the cholesterol that a person's body needs. Having high cholesterol (hypercholesterolemia) increases your risk for heart disease and stroke. Extra or excess cholesterol comes from the food that you eat. High cholesterol can often be  prevented with diet and lifestyle changes. If you already have high cholesterol, you can control it with diet, lifestyle changes, and medicines. How can high cholesterol affect me? If you have high cholesterol, fatty deposits (plaques) may build up on the walls of your blood vessels. The blood vessels that carry blood away from your heart are called arteries. Plaques make the arteries narrower and stiffer. This in turn can: Restrict or block blood flow and cause blood clots to form. Increase your risk for heart attack and stroke. What can increase my risk for high cholesterol? This  condition is more likely to develop in people who: Eat foods that are high in saturated fat or cholesterol. Saturated fat is mostly found in foods that come from animal sources. Are overweight. Are not getting enough exercise. Use products that contain nicotine or tobacco, such as cigarettes, e-cigarettes, and chewing tobacco. Have a family history of high cholesterol (familial hypercholesterolemia). What actions can I take to prevent this? Nutrition  Eat less saturated fat. Avoid trans fats (partially hydrogenated oils). These are often found in margarine and in some baked goods, fried foods, and snacks bought in packages. Avoid precooked or cured meat, such as bacon, sausages, or meat loaves. Avoid foods and drinks that have added sugars. Eat more fruits, vegetables, and whole grains. Choose healthy sources of protein, such as fish, poultry, lean cuts of red meat, beans, peas, lentils, and nuts. Choose healthy sources of fat, such as: Nuts. Vegetable oils, especially olive oil. Fish that have healthy fats, such as omega-3 fatty acids. These fish include mackerel or salmon. Lifestyle Lose weight if you are overweight. Maintaining a healthy body mass index (BMI) can help prevent or control high cholesterol. It can also lower your risk for diabetes and high blood pressure. Ask your health care provider to help you  with a diet and exercise plan to lose weight safely. Do not use any products that contain nicotine or tobacco. These products include cigarettes, chewing tobacco, and vaping devices, such as e-cigarettes. If you need help quitting, ask your health care provider. Alcohol use Do not drink alcohol if: Your health care provider tells you not to drink. You are pregnant, may be pregnant, or are planning to become pregnant. If you drink alcohol: Limit how much you have to: 0-1 drink a day for women. 0-2 drinks a day for men. Know how much alcohol is in your drink. In the [place] one drink equals one 12 oz bottle of beer (355 mL), one 5 oz glass of wine (148 mL), or one 1 oz glass of hard liquor (44 mL). Activity  Get enough exercise. Do exercises as told by your health care provider. Each week, do at least 150 minutes of exercise that takes a medium level of effort (moderate-intensity exercise). This kind of exercise: Makes your heart beat faster while allowing you to still be able to talk. Can be done in short sessions several times a day or longer sessions a few times a week. For example, on 5 days each week, you could walk fast or ride your bike 3 times a day for 10 minutes each time. Medicines Your health care provider may recommend medicines to help lower cholesterol. This may be a medicine to lower the amount of cholesterol that your liver makes. You may need medicine if: Diet and lifestyle changes have not lowered your cholesterol enough. You have high cholesterol and other risk factors for heart disease or stroke. Take over-the-counter and prescription medicines only as told by your health care provider. General information Manage your risk factors for high cholesterol. Talk with your health care provider about all your risk factors and how to lower your risk. Manage other conditions that you have, such as diabetes or high blood pressure (hypertension). Have blood tests to check your  cholesterol levels at regular points in time as told by your health care provider. Keep all follow-up visits. This is important. Where to find more information American Heart Association: www.heart.org National Heart, Lung, and Blood Institute: https://wilson-eaton.com/ Summary High cholesterol increases your risk for heart  disease and stroke. By keeping your cholesterol level low, you can reduce your risk for these conditions. High cholesterol can often be prevented with diet and lifestyle changes. Work with your health care provider to manage your risk factors, and have your blood tested regularly. This information is not intended to replace advice given to you by your health care provider. Make sure you discuss any questions you have with your health care provider. Document Revised: 04/25/2022 Document Reviewed: 11/26/2020 Elsevier Patient Education  [place], [place] 08/25/2022

## 2022-08-25 NOTE — Assessment & Plan Note (Signed)
-   Thyroid Panel With TSH  Follow up:  Follow up in 3 months

## 2022-08-26 LAB — THYROID PANEL WITH TSH
Free Thyroxine Index: 2.9 (ref 1.2–4.9)
T3 Uptake Ratio: 31 % (ref 24–39)
T4, Total: 9.5 ug/dL (ref 4.5–12.0)
TSH: 0.591 u[IU]/mL (ref 0.450–4.500)

## 2022-09-01 ENCOUNTER — Encounter: Payer: Self-pay | Admitting: *Deleted

## 2022-11-17 ENCOUNTER — Other Ambulatory Visit: Payer: Self-pay

## 2022-11-17 DIAGNOSIS — Z85850 Personal history of malignant neoplasm of thyroid: Secondary | ICD-10-CM

## 2022-11-17 DIAGNOSIS — E890 Postprocedural hypothyroidism: Secondary | ICD-10-CM

## 2022-11-17 MED ORDER — LEVOTHYROXINE SODIUM 88 MCG PO TABS: 88.0000 ug | ORAL_TABLET | Freq: Every day | ORAL | 0 refills | Status: DC

## 2022-11-17 NOTE — Telephone Encounter (Signed)
From: Maley Wnek To: Office of Fenton Foy, NP Sent: 11/17/2022 8:14 AM EST Subject: Medication Renewal Request  Refills have been requested for the following medications:   levothyroxine (SYNTHROID) 88 MCG tablet [name]]  Preferred pharmacy: [facility], [address] AT [place] Delivery method: Arlyss Gandy

## 2022-12-29 ENCOUNTER — Other Ambulatory Visit: Payer: Self-pay

## 2022-12-29 DIAGNOSIS — Z85850 Personal history of malignant neoplasm of thyroid: Secondary | ICD-10-CM

## 2022-12-29 DIAGNOSIS — E890 Postprocedural hypothyroidism: Secondary | ICD-10-CM

## 2022-12-29 MED ORDER — LEVOTHYROXINE SODIUM 88 MCG PO TABS: 88.0000 ug | ORAL_TABLET | Freq: Every day | ORAL | 0 refills | Status: DC

## 2022-12-29 NOTE — Telephone Encounter (Signed)
From: Lowella Wickizer To: Office of Fenton Foy, NP Sent: 12/26/2022 7:22 PM EDT Subject: Medication Renewal Request  Refills have been requested for the following medications:   levothyroxine (SYNTHROID) 88 MCG tablet [name]]  Preferred pharmacy: [facility] [place], [address] AT [place] Delivery method: Arlyss Gandy

## 2023-01-29 ENCOUNTER — Other Ambulatory Visit: Payer: Self-pay

## 2023-01-29 DIAGNOSIS — E890 Postprocedural hypothyroidism: Secondary | ICD-10-CM

## 2023-01-29 DIAGNOSIS — Z85850 Personal history of malignant neoplasm of thyroid: Secondary | ICD-10-CM

## 2023-01-29 MED ORDER — LEVOTHYROXINE SODIUM 88 MCG PO TABS: 88.0000 ug | ORAL_TABLET | Freq: Every day | ORAL | 0 refills | Status: DC

## 2023-01-29 NOTE — Telephone Encounter (Signed)
From: Charlotta Pizzimenti To: Office of Ivonne Andrew, NP Sent: 01/29/2023 12:47 PM EDT Subject: Medication Renewal Request  Refills have been requested for the following medications:   levothyroxine (SYNTHROID) 88 MCG tablet Archie Patten S [place]]  Preferred pharmacy: [id number] - [place], [place] - [address] AT [facility] OF [address] Delivery method: Daryll Drown

## 2023-02-23 ENCOUNTER — Encounter: Payer: Self-pay | Admitting: Nurse Practitioner

## 2023-02-23 ENCOUNTER — Ambulatory Visit: Payer: Medicaid Other | Admitting: Nurse Practitioner

## 2023-02-23 VITALS — BP 111/55 | HR 69 | Temp 97.3°F | Wt 143.6 lb

## 2023-02-23 DIAGNOSIS — E890 Postprocedural hypothyroidism: Secondary | ICD-10-CM

## 2023-02-23 DIAGNOSIS — E039 Hypothyroidism, unspecified: Secondary | ICD-10-CM

## 2023-02-23 DIAGNOSIS — Z1211 Encounter for screening for malignant neoplasm of colon: Secondary | ICD-10-CM | POA: Insufficient documentation

## 2023-02-23 DIAGNOSIS — Z1231 Encounter for screening mammogram for malignant neoplasm of breast: Secondary | ICD-10-CM

## 2023-02-23 DIAGNOSIS — Z85850 Personal history of malignant neoplasm of thyroid: Secondary | ICD-10-CM | POA: Diagnosis not present

## 2023-02-23 MED ORDER — LEVOTHYROXINE SODIUM 88 MCG PO TABS
88.0000 ug | ORAL_TABLET | Freq: Every day | ORAL | 1 refills | Status: DC
Start: 2023-02-23 — End: 2023-02-25

## 2023-02-23 NOTE — Assessment & Plan Note (Signed)
-   Cologuard  2. Encounter for screening mammogram for malignant neoplasm of breast  - MM 3D SCREENING MAMMOGRAM BILATERAL BREAST; Future  3. Hypothyroidism (acquired)  - Thyroid Panel With TSH - CBC - Comprehensive metabolic panel  4. History of thyroidectomy  - levothyroxine (SYNTHROID) 88 MCG tablet; Take 1 tablet (88 mcg total) by mouth daily before breakfast.  Dispense: 90 tablet; Refill: 1  5. History of thyroid cancer  - levothyroxine (SYNTHROID) 88 MCG tablet; Take 1 tablet (88 mcg total) by mouth daily before breakfast.  Dispense: 90 tablet; Refill: 1  Follow up:  Follow up in 6 months

## 2023-02-23 NOTE — Progress Notes (Signed)
@Patient  ID: Marissa [name], female    DOB: April 26, 1966, 57 y.o.   MRN: [id number]  Chief Complaint  Patient presents with   Follow-up    Over all health and thyroid    Referring provider: Ivonne Andrew, NP   HPI  Marissa [name] 57 y.o. female   has a past medical history of thyroid cancer ([facility]) and History of kidney stones.     Patient presents today for follow-up on thyroid.  Overall she states that she has been doing well.  We will recheck thyroid panel today.  Would like 90 day refill on thyroid medications. Denies f/c/s, n/v/d, hemoptysis, PND, leg swelling Denies chest pain or edema     No Known Allergies  Immunization History  Administered Date(s) Administered   Influenza Split 07/24/2012   Influenza,inj,Quad PF,6+ Mos 06/18/2021   Tdap 07/25/2019   Unspecified SARS-COV-2 Vaccination 01/04/2020, 01/18/2020    Past Medical History:  Diagnosis Date   Cancer ([facility])    THYROID   CANCER   History of kidney stones     Tobacco History: Social History   Tobacco Use  Smoking Status Never  Smokeless Tobacco Never   Counseling given: Not Answered   Outpatient Encounter Medications as of 02/23/2023  Medication Sig   [DISCONTINUED] levothyroxine (SYNTHROID) 88 MCG tablet Take 1 tablet (88 mcg total) by mouth daily before breakfast.   acetaminophen (TYLENOL) 500 MG tablet Take 1,000 mg by mouth every 6 (six) hours as needed for moderate pain.   levothyroxine (SYNTHROID) 88 MCG tablet Take 1 tablet (88 mcg total) by mouth daily before breakfast.   [DISCONTINUED] Cetirizine HCl 10 MG CAPS Take 1 capsule (10 mg total) by mouth daily.   [DISCONTINUED] gabapentin (NEURONTIN) 300 MG capsule 1 tab po at bedtime 1st day, 1 tablet bid second day, then 1 tablet tid   No facility-administered encounter medications on file as of 02/23/2023.     Review of Systems  Review of Systems  Constitutional: Negative.   HENT: Negative.    Cardiovascular: Negative.   Gastrointestinal:  Negative.   Allergic/Immunologic: Negative.   Neurological: Negative.   Psychiatric/Behavioral: Negative.         Physical Exam  BP (!) 111/55   Pulse 69   Temp (!) 97.3 F (36.3 C)   Wt 143 lb 9.6 oz (65.1 kg)   SpO2 97%   BMI 27.13 kg/m   Wt Readings from Last 5 Encounters:  02/23/23 143 lb 9.6 oz (65.1 kg)  08/25/22 146 lb (66.2 kg)  05/22/22 142 lb (64.4 kg)  08/05/21 133 lb (60.3 kg)  07/26/21 131 lb 0.2 oz (59.4 kg)     Physical Exam Vitals and nursing note reviewed.  Constitutional:      General: She is not in acute distress.    Appearance: She is well-developed.  Cardiovascular:     Rate and Rhythm: Normal rate and regular rhythm.  Pulmonary:     Effort: Pulmonary effort is normal.     Breath sounds: Normal breath sounds.  Neurological:     Mental Status: She is alert and oriented to person, place, and time.      Lab Results:  CBC    Component Value Date/Time   WBC 3.7 05/22/2022 1034   WBC 5.1 06/07/2019 1420   RBC 4.93 05/22/2022 1034   RBC 5.11 06/07/2019 1420   HGB 11.7 05/22/2022 1034   HCT 38.0 05/22/2022 1034   PLT 188 05/22/2022 1034   MCV 77 (L) 05/22/2022  1034   MCH 23.7 (L) 05/22/2022 1034   MCH 23.5 (L) 06/07/2019 1420   MCHC 30.8 (L) 05/22/2022 1034   MCHC 30.8 06/07/2019 1420   RDW 13.9 05/22/2022 1034   LYMPHSABS 1.2 06/26/2021 0954   MONOABS 0.1 02/18/2013 1047   EOSABS 0.1 06/26/2021 0954   BASOSABS 0.0 06/26/2021 0954    BMET    Component Value Date/Time   NA 142 05/22/2022 1034   K 3.9 05/22/2022 1034   CL 105 05/22/2022 1034   CO2 21 05/22/2022 1034   GLUCOSE 72 05/22/2022 1034   GLUCOSE 97 06/07/2019 1420   BUN 15 05/22/2022 1034   CREATININE 0.66 05/22/2022 1034   CALCIUM 9.0 05/22/2022 1034   GFRNONAA >60 06/07/2019 1420   GFRAA >60 06/07/2019 1420     Assessment & Plan:   Colon cancer screening - Cologuard  2. Encounter for screening mammogram for malignant neoplasm of breast  - MM 3D SCREENING  MAMMOGRAM BILATERAL BREAST; Future  3. Hypothyroidism (acquired)  - Thyroid Panel With TSH - CBC - Comprehensive metabolic panel  4. History of thyroidectomy  - levothyroxine (SYNTHROID) 88 MCG tablet; Take 1 tablet (88 mcg total) by mouth daily before breakfast.  Dispense: 90 tablet; Refill: 1  5. History of thyroid cancer  - levothyroxine (SYNTHROID) 88 MCG tablet; Take 1 tablet (88 mcg total) by mouth daily before breakfast.  Dispense: 90 tablet; Refill: 1  Follow up:  Follow up in 6 months     Ivonne Andrew, NP 02/23/2023

## 2023-02-23 NOTE — Patient Instructions (Signed)
1. Colon cancer screening  - Cologuard  2. Encounter for screening mammogram for malignant neoplasm of breast  - MM 3D SCREENING MAMMOGRAM BILATERAL BREAST; Future  3. Hypothyroidism (acquired)  - Thyroid Panel With TSH - CBC - Comprehensive metabolic panel  4. History of thyroidectomy  - levothyroxine (SYNTHROID) 88 MCG tablet; Take 1 tablet (88 mcg total) by mouth daily before breakfast.  Dispense: 90 tablet; Refill: 1  5. History of thyroid cancer  - levothyroxine (SYNTHROID) 88 MCG tablet; Take 1 tablet (88 mcg total) by mouth daily before breakfast.  Dispense: 90 tablet; Refill: 1  Follow up:  Follow up in 6 months

## 2023-02-24 ENCOUNTER — Ambulatory Visit
Admission: RE | Admit: 2023-02-24 | Discharge: 2023-02-24 | Disposition: A | Discharge status: Home / Self Care | Payer: Medicaid Other | Source: Ambulatory Visit | Attending: Nurse Practitioner | Admitting: Nurse Practitioner

## 2023-02-24 DIAGNOSIS — Z1231 Encounter for screening mammogram for malignant neoplasm of breast: Secondary | ICD-10-CM

## 2023-02-24 LAB — COMPREHENSIVE METABOLIC PANEL
ALT: 13 IU/L (ref 0–32)
AST: 23 IU/L (ref 0–40)
Albumin/Globulin Ratio: 1.6 (ref 1.2–2.2)
Albumin: 4.7 g/dL (ref 3.8–4.9)
Alkaline Phosphatase: 131 IU/L — ABNORMAL HIGH (ref 44–121)
BUN/Creatinine Ratio: 20 (ref 9–23)
BUN: 15 mg/dL (ref 6–24)
Bilirubin Total: 0.5 mg/dL (ref 0.0–1.2)
CO2: 20 mmol/L (ref 20–29)
Calcium: 9.3 mg/dL (ref 8.7–10.2)
Chloride: 105 mmol/L (ref 96–106)
Creatinine, Ser: 0.74 mg/dL (ref 0.57–1.00)
Globulin, Total: 3 g/dL (ref 1.5–4.5)
Glucose: 95 mg/dL (ref 70–99)
Potassium: 4.3 mmol/L (ref 3.5–5.2)
Sodium: 140 mmol/L (ref 134–144)
Total Protein: 7.7 g/dL (ref 6.0–8.5)
eGFR: 95 mL/min/{1.73_m2} (ref >59)

## 2023-02-24 LAB — THYROID PANEL WITH TSH
Free Thyroxine Index: 3.2 (ref 1.2–4.9)
T3 Uptake Ratio: 33 % (ref 24–39)
T4, Total: 9.6 ug/dL (ref 4.5–12.0)
TSH: 0.393 u[IU]/mL — ABNORMAL LOW (ref 0.450–4.500)

## 2023-02-24 LAB — CBC
Hematocrit: 37.9 % (ref 34.0–46.6)
Hemoglobin: 11.8 g/dL (ref 11.1–15.9)
MCH: 23 pg — ABNORMAL LOW (ref 26.6–33.0)
MCHC: 31.1 g/dL — ABNORMAL LOW (ref 31.5–35.7)
MCV: 74 fL — ABNORMAL LOW (ref 79–97)
Platelets: 188 10*3/uL (ref 150–450)
RBC: 5.13 x10E6/uL (ref 3.77–5.28)
RDW: 14.6 % (ref 11.7–15.4)
WBC: 5 10*3/uL (ref 3.4–10.8)

## 2023-02-25 ENCOUNTER — Other Ambulatory Visit: Payer: Self-pay | Admitting: Nurse Practitioner

## 2023-02-25 DIAGNOSIS — E039 Hypothyroidism, unspecified: Secondary | ICD-10-CM

## 2023-02-25 MED ORDER — LEVOTHYROXINE SODIUM 75 MCG PO TABS: 75.0000 ug | ORAL_TABLET | Freq: Every day | ORAL | 11 refills | Status: DC

## 2023-03-07 LAB — COLOGUARD: COLOGUARD: NEGATIVE

## 2023-04-24 ENCOUNTER — Other Ambulatory Visit: Payer: Self-pay

## 2023-04-28 ENCOUNTER — Other Ambulatory Visit: Payer: Medicaid Other

## 2023-04-28 DIAGNOSIS — E039 Hypothyroidism, unspecified: Secondary | ICD-10-CM

## 2023-04-29 ENCOUNTER — Other Ambulatory Visit: Payer: Self-pay | Admitting: Nurse Practitioner

## 2023-04-29 LAB — THYROID PANEL WITH TSH
Free Thyroxine Index: 1.1 — ABNORMAL LOW (ref 1.2–4.9)
T3 Uptake Ratio: 25 % (ref 24–39)
T4, Total: 4.5 ug/dL (ref 4.5–12.0)
TSH: 22.2 u[IU]/mL — ABNORMAL HIGH (ref 0.450–4.500)

## 2023-04-29 MED ORDER — LEVOTHYROXINE SODIUM 112 MCG PO TABS: 112.0000 ug | ORAL_TABLET | Freq: Every day | ORAL | 11 refills | Status: DC

## 2023-06-13 ENCOUNTER — Other Ambulatory Visit: Payer: Self-pay

## 2023-06-13 ENCOUNTER — Emergency Department (HOSPITAL_COMMUNITY): Payer: Medicaid Other

## 2023-06-13 ENCOUNTER — Emergency Department (HOSPITAL_COMMUNITY)
Admission: EM | Admit: 2023-06-13 | Discharge: 2023-06-13 | Disposition: A | Discharge status: Home / Self Care | Payer: Medicaid Other | Attending: Emergency Medicine | Admitting: Emergency Medicine

## 2023-06-13 ENCOUNTER — Encounter (HOSPITAL_COMMUNITY): Payer: Self-pay

## 2023-06-13 DIAGNOSIS — Y9311 Activity, swimming: Secondary | ICD-10-CM | POA: Diagnosis not present

## 2023-06-13 DIAGNOSIS — U071 COVID-19: Secondary | ICD-10-CM | POA: Diagnosis not present

## 2023-06-13 DIAGNOSIS — R0602 Shortness of breath: Secondary | ICD-10-CM | POA: Diagnosis present

## 2023-06-13 DIAGNOSIS — S2232XA Fracture of one rib, left side, initial encounter for closed fracture: Secondary | ICD-10-CM | POA: Insufficient documentation

## 2023-06-13 LAB — BASIC METABOLIC PANEL
Anion gap: 8 (ref 5–15)
BUN: 12 mg/dL (ref 6–20)
CO2: 23 mmol/L (ref 22–32)
Calcium: 9 mg/dL (ref 8.9–10.3)
Chloride: 108 mmol/L (ref 98–111)
Creatinine, Ser: 0.64 mg/dL (ref 0.44–1.00)
GFR, Estimated: >60 mL/min (ref >60)
Glucose, Bld: 99 mg/dL (ref 70–99)
Potassium: 3.5 mmol/L (ref 3.5–5.1)
Sodium: 139 mmol/L (ref 135–145)

## 2023-06-13 LAB — CBC
HCT: 37.7 % (ref 36.0–46.0)
Hemoglobin: 11.3 g/dL — ABNORMAL LOW (ref 12.0–15.0)
MCH: 22.1 pg — ABNORMAL LOW (ref 26.0–34.0)
MCHC: 30 g/dL (ref 30.0–36.0)
MCV: 73.8 fL — ABNORMAL LOW (ref 80.0–100.0)
Platelets: 204 10*3/uL (ref 150–400)
RBC: 5.11 MIL/uL (ref 3.87–5.11)
RDW: 14.5 % (ref 11.5–15.5)
WBC: 8.4 10*3/uL (ref 4.0–10.5)
nRBC: 0 % (ref 0.0–0.2)

## 2023-06-13 LAB — SARS CORONAVIRUS 2 BY RT PCR: SARS Coronavirus 2 by RT PCR: POSITIVE — AB

## 2023-06-13 LAB — TROPONIN I (HIGH SENSITIVITY)
Troponin I (High Sensitivity): 3 ng/L (ref <18)
Troponin I (High Sensitivity): 3 ng/L (ref <18)

## 2023-06-13 LAB — D-DIMER, QUANTITATIVE: D-Dimer, Quant: 0.91 ug{FEU}/mL — ABNORMAL HIGH (ref 0.00–0.50)

## 2023-06-13 MED ORDER — OXYCODONE HCL 5 MG PO TABS
2.5000 mg | ORAL_TABLET | Freq: Four times a day (QID) | ORAL | 0 refills | Status: AC | PRN
Start: 2023-06-13 — End: ?

## 2023-06-13 MED ORDER — MORPHINE SULFATE (PF) 4 MG/ML IV SOLN
4.0000 mg | Freq: Once | INTRAVENOUS | Status: AC
  Administered 2023-06-13: 4 mg via INTRAVENOUS
  Filled 2023-06-13: qty 1

## 2023-06-13 MED ORDER — IOHEXOL 350 MG/ML SOLN
75.0000 mL | Freq: Once | INTRAVENOUS | Status: AC | PRN
  Administered 2023-06-13: 75 mL via INTRAVENOUS

## 2023-06-13 MED ORDER — BENZONATATE 100 MG PO CAPS
100.0000 mg | ORAL_CAPSULE | Freq: Three times a day (TID) | ORAL | 0 refills | Status: AC
Start: 2023-06-13 — End: ?

## 2023-06-13 MED ORDER — FENTANYL CITRATE PF 50 MCG/ML IJ SOSY
50.0000 ug | PREFILLED_SYRINGE | Freq: Once | INTRAMUSCULAR | Status: AC
  Administered 2023-06-13: 50 ug via INTRAVENOUS
  Filled 2023-06-13: qty 1

## 2023-06-13 NOTE — ED Notes (Signed)
Incentive spirometer provided with instructions written and verbal. Patient and family verbalized understanding
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
## 2023-06-13 NOTE — Discharge Instructions (Addendum)
Get help right away if: You have difficulty breathing or you are short of breath. You develop a cough that does not stop, or you cough up thick or bloody sputum. You have nausea, vomiting, or pain in your abdomen. Your pain gets worse and medicine does not help. These symptoms may represent a serious problem that is an emergency. Do not wait to see if the symptoms will go away. Get medical help right away. Call your local emergency services (911 in the [place] Do not drive yourself to the hospital. IMPORTANT INFORMATION ABOUT YOUR OPIOID PRESCRIPTION    What are Opioids? How Can I Safely Take Opioids? Where Can I Get Help?  Opioids are narcotic pain medications used for moderate-to-severe pain, often after surgery or injury. Evidence shows there may be better and safer options for long term pain from back pain, fibromyalgia, and nerve pain other than opioids. Do not take more tablets or capsules than prescribed by your doctor or more often than prescribed. Do not crush long-acting tablets. Do not share or sell your prescription. Store medication in a safe, secure, dry location away from pets, children, family, and visitors. Naloxone, a medication that can reverse opioid-induced oversedation, can be purchased at some pharmacies and kept on hand for emergency use. IF YOU NEED HELP for substance abuse problems, talk with your primary care provider and contact a local resource: [facility] [phone] [facility] [phone] CURE (Community United in Response to an Epidemic)     [facility] [phone] [facility] Solution to the Opioid Problem)  [phone] [facility] [phone] Narcotics Anonymous www.[facility]  [facility] [phone] [facility]  [phone] [facility] National Helpline 1-800-662-HELP or visit https://findtreatment.[facility].pl to  find a treatment center near you [facility] [phone] [facility] ([phone] Updated 04/27/2018  What are the Major  Risks of Opioid Use?    Opioid use can have serious risk of addiction and overdose. Overdose is often seen as slow breathing and can cause sudden death. Other risks include tolerance (needing more for the same relief) and dependence (withdrawal when stopped).     How Do I Get Rid of Unused Opioids?    Safely dispose of all opioids as soon as you no longer need them. To dispose at home, mix the drug with an undesirable substance like coffee grounds or cat litter then throw them away. Opioids can be dropped off at local sheriff's offices or police departments. If unable to safely dispose of them as above, opioids may be flushed.   What are Some Ways to Help Decrease Major Risk for Overdose?    Avoid alcohol, other opioids, sedating antihistamines (Benadryl, etc.) drugs for sleep/anxiety (Xanax, Ativan, Valium, Ambien, etc.), and muscle relaxants (Flexeril, Soma, Skelaxin, etc.).

## 2023-06-13 NOTE — ED Provider Triage Note (Signed)
Emergency Medicine Provider Triage Evaluation Note  Kamron Colburn , a 57 y.o. female  was evaluated in triage.  Pt complains of back pain that radiates to the chest in the last 4 days.  Constant, on the left side of the back radiating to left side of the chest.  States pain is 9 out of 10 in triage.  Denies any fever, nausea, vomiting, extremity weakness or numbness, diaphoresis.  No personal or family history of PE/DVT.  Review of Systems  Positive: As above Negative: As above  Physical Exam  BP 123/80 (BP Location: Right Arm)   Pulse 85   Temp 98.8 F (37.1 C) (Oral)   Resp 18   Ht 5\' 2"  (1.575 m)   SpO2 95%   BMI 26.26 kg/m  Gen:   Awake, no distress   Resp:  Normal effort  MSK:   Moves extremities without difficulty  Other:    Medical Decision Making  Medically screening exam initiated at 2:02 PM.  Appropriate orders placed.  Joleen Cygan was informed that the remainder of the evaluation will be completed by another provider, this initial triage assessment does not replace that evaluation, and the importance of remaining in the ED until their evaluation is complete.    Jeanelle Malling, [place] 06/13/23 1451

## 2023-06-15 ENCOUNTER — Ambulatory Visit: Payer: Medicaid Other | Admitting: Nurse Practitioner

## 2023-06-15 ENCOUNTER — Encounter: Payer: Self-pay | Admitting: Nurse Practitioner

## 2023-06-15 VITALS — BP 129/68 | HR 78 | Ht 62.0 in | Wt 145.0 lb

## 2023-06-15 DIAGNOSIS — R9389 Abnormal findings on diagnostic imaging of other specified body structures: Secondary | ICD-10-CM | POA: Diagnosis not present

## 2023-06-15 DIAGNOSIS — J398 Other specified diseases of upper respiratory tract: Secondary | ICD-10-CM | POA: Diagnosis not present

## 2023-06-15 DIAGNOSIS — E039 Hypothyroidism, unspecified: Secondary | ICD-10-CM

## 2023-06-15 DIAGNOSIS — Z23 Encounter for immunization: Secondary | ICD-10-CM | POA: Diagnosis not present
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
## 2023-06-15 NOTE — Patient Instructions (Addendum)
1. Need for influenza vaccination  - Flu vaccine trivalent PF, 6mos and older(Flulaval,Afluria,Fluarix,Fluzone)  2. Abnormal CT scan  - Ambulatory referral to ENT  3. Tracheal mass  - Ambulatory referral to ENT  4. Hypothyroidism (acquired)  - Thyroid Panel With TSH  Follow up:  Follow up as scheduled

## 2023-06-15 NOTE — Assessment & Plan Note (Signed)
-   Flu vaccine trivalent PF, 6mos and older(Flulaval,Afluria,Fluarix,Fluzone)  2. Abnormal CT scan  - Ambulatory referral to ENT  3. Tracheal mass  - Ambulatory referral to ENT  4. Hypothyroidism (acquired)  - Thyroid Panel With TSH  Follow up:  Follow up as scheduled

## 2023-06-16 LAB — THYROID PANEL WITH TSH
Free Thyroxine Index: 3.1 (ref 1.2–4.9)
T3 Uptake Ratio: 35 % (ref 24–39)
T4, Total: 8.9 ug/dL (ref 4.5–12.0)
TSH: 0.15 u[IU]/mL — ABNORMAL LOW (ref 0.450–4.500)

## 2023-06-17 ENCOUNTER — Telehealth (INDEPENDENT_AMBULATORY_CARE_PROVIDER_SITE_OTHER): Payer: Self-pay | Admitting: Physician Assistant

## 2023-06-17 ENCOUNTER — Other Ambulatory Visit: Payer: Self-pay | Admitting: Nurse Practitioner

## 2023-06-17 MED ORDER — LEVOTHYROXINE SODIUM 100 MCG PO TABS: 100.0000 ug | ORAL_TABLET | Freq: Every day | ORAL | 11 refills | Status: DC

## 2023-06-17 NOTE — Telephone Encounter (Signed)
We received referral stating: Tracheal mass and Abnormal CT scan. Please advise

## 2023-06-17 NOTE — Telephone Encounter (Signed)
Thanks, lets get her on for next available

## 2023-08-26 ENCOUNTER — Encounter: Payer: Self-pay | Admitting: Nurse Practitioner

## 2023-08-26 ENCOUNTER — Ambulatory Visit: Payer: Medicaid Other | Admitting: Nurse Practitioner

## 2023-08-26 VITALS — BP 115/57 | HR 73 | Resp 12 | Ht 62.0 in | Wt 145.0 lb

## 2023-08-26 DIAGNOSIS — E039 Hypothyroidism, unspecified: Secondary | ICD-10-CM
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
## 2023-08-26 NOTE — Patient Instructions (Addendum)
1. Hypothyroidism (acquired)  - Thyroid Panel With TSH   Follow up:  Follow up in 6 months

## 2023-08-27 ENCOUNTER — Other Ambulatory Visit (HOSPITAL_COMMUNITY): Payer: Self-pay | Admitting: Nurse Practitioner

## 2023-08-27 DIAGNOSIS — E039 Hypothyroidism, unspecified: Secondary | ICD-10-CM

## 2023-08-27 LAB — THYROID PANEL WITH TSH
Free Thyroxine Index: 3 (ref 1.2–4.9)
T3 Uptake Ratio: 33 % (ref 24–39)
T4, Total: 9.1 ug/dL (ref 4.5–12.0)
TSH: 0.043 u[IU]/mL — ABNORMAL LOW (ref 0.450–4.500)

## 2023-08-27 MED ORDER — LEVOTHYROXINE SODIUM 88 MCG PO TABS: 88.0000 ug | ORAL_TABLET | Freq: Every day | ORAL | 11 refills | Status: DC

## 2023-10-05 ENCOUNTER — Ambulatory Visit (INDEPENDENT_AMBULATORY_CARE_PROVIDER_SITE_OTHER): Payer: Medicaid Other | Admitting: Otolaryngology

## 2023-10-05 ENCOUNTER — Encounter (INDEPENDENT_AMBULATORY_CARE_PROVIDER_SITE_OTHER): Payer: Self-pay | Admitting: Otolaryngology

## 2023-10-05 VITALS — BP 137/79 | HR 79 | Ht 61.0 in | Wt 146.0 lb

## 2023-10-05 DIAGNOSIS — Z85850 Personal history of malignant neoplasm of thyroid: Secondary | ICD-10-CM | POA: Diagnosis not present

## 2023-10-05 DIAGNOSIS — E890 Postprocedural hypothyroidism: Secondary | ICD-10-CM

## 2023-10-05 DIAGNOSIS — R9389 Abnormal findings on diagnostic imaging of other specified body structures: Secondary | ICD-10-CM

## 2023-10-05 DIAGNOSIS — R222 Localized swelling, mass and lump, trunk: Secondary | ICD-10-CM
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
## 2023-11-10 ENCOUNTER — Encounter: Payer: Medicaid Other | Admitting: Thoracic Surgery (Cardiothoracic Vascular Surgery)

## 2024-02-23 ENCOUNTER — Ambulatory Visit: Admitting: Nurse Practitioner

## 2024-02-23 ENCOUNTER — Encounter: Payer: Self-pay | Admitting: Nurse Practitioner

## 2024-02-23 VITALS — BP 116/66 | HR 71 | Temp 98.1°F | Wt 147.2 lb

## 2024-02-23 DIAGNOSIS — R222 Localized swelling, mass and lump, trunk: Secondary | ICD-10-CM | POA: Diagnosis not present

## 2024-02-23 DIAGNOSIS — J398 Other specified diseases of upper respiratory tract: Secondary | ICD-10-CM | POA: Diagnosis not present

## 2024-02-23 DIAGNOSIS — Z13220 Encounter for screening for lipoid disorders: Secondary | ICD-10-CM

## 2024-02-23 DIAGNOSIS — E039 Hypothyroidism, unspecified: Secondary | ICD-10-CM

## 2024-02-23 MED ORDER — FUROSEMIDE 20 MG PO TABS: 20.0000 mg | ORAL_TABLET | Freq: Every day | ORAL | 0 refills | Status: AC
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
## 2024-02-23 NOTE — Patient Instructions (Signed)
 1. Lipid screening (Primary)  - Lipid Panel  2. Hypothyroidism (acquired)  - Thyroid  Panel With TSH - CBC - Comprehensive metabolic panel with GFR

## 2024-02-24 ENCOUNTER — Ambulatory Visit: Payer: Self-pay | Admitting: Nurse Practitioner

## 2024-02-24 LAB — CBC
Hematocrit: 40.1 % (ref 34.0–46.6)
Hemoglobin: 12.5 g/dL (ref 11.1–15.9)
MCH: 23.7 pg — ABNORMAL LOW (ref 26.6–33.0)
MCHC: 31.2 g/dL — ABNORMAL LOW (ref 31.5–35.7)
MCV: 76 fL — ABNORMAL LOW (ref 79–97)
Platelets: 214 10*3/uL (ref 150–450)
RBC: 5.28 x10E6/uL (ref 3.77–5.28)
RDW: 14.6 % (ref 11.7–15.4)
WBC: 5.5 10*3/uL (ref 3.4–10.8)

## 2024-02-24 LAB — COMPREHENSIVE METABOLIC PANEL WITH GFR
ALT: 21 IU/L (ref 0–32)
AST: 25 IU/L (ref 0–40)
Albumin: 4.6 g/dL (ref 3.8–4.9)
Alkaline Phosphatase: 158 IU/L — ABNORMAL HIGH (ref 44–121)
BUN/Creatinine Ratio: 25 — ABNORMAL HIGH (ref 9–23)
BUN: 18 mg/dL (ref 6–24)
Bilirubin Total: 0.3 mg/dL (ref 0.0–1.2)
CO2: 19 mmol/L — ABNORMAL LOW (ref 20–29)
Calcium: 9.6 mg/dL (ref 8.7–10.2)
Chloride: 106 mmol/L (ref 96–106)
Creatinine, Ser: 0.71 mg/dL (ref 0.57–1.00)
Globulin, Total: 3.3 g/dL (ref 1.5–4.5)
Glucose: 91 mg/dL (ref 70–99)
Potassium: 4.8 mmol/L (ref 3.5–5.2)
Sodium: 141 mmol/L (ref 134–144)
Total Protein: 7.9 g/dL (ref 6.0–8.5)
eGFR: 100 mL/min/{1.73_m2} (ref >59)

## 2024-02-24 LAB — LIPID PANEL
Chol/HDL Ratio: 4 ratio (ref 0.0–4.4)
Cholesterol, Total: 196 mg/dL (ref 100–199)
HDL: 49 mg/dL (ref >39)
LDL Chol Calc (NIH): 129 mg/dL — ABNORMAL HIGH (ref 0–99)
Triglycerides: 100 mg/dL (ref 0–149)
VLDL Cholesterol Cal: 18 mg/dL (ref 5–40)

## 2024-02-24 LAB — THYROID PANEL WITH TSH
Free Thyroxine Index: 2.8 (ref 1.2–4.9)
T3 Uptake Ratio: 31 % (ref 24–39)
T4, Total: 9 ug/dL (ref 4.5–12.0)
TSH: 1.47 u[IU]/mL (ref 0.450–4.500)

## 2024-02-25 ENCOUNTER — Ambulatory Visit: Payer: Self-pay | Admitting: Nurse Practitioner

## 2024-05-06 ENCOUNTER — Encounter: Payer: Self-pay | Admitting: Pulmonary Disease

## 2024-08-29 ENCOUNTER — Encounter: Payer: Self-pay | Admitting: Nurse Practitioner

## 2024-08-29 ENCOUNTER — Ambulatory Visit (INDEPENDENT_AMBULATORY_CARE_PROVIDER_SITE_OTHER): Payer: Self-pay | Admitting: Nurse Practitioner

## 2024-08-29 VITALS — BP 125/72 | HR 77 | Wt 148.0 lb

## 2024-08-29 DIAGNOSIS — E039 Hypothyroidism, unspecified: Secondary | ICD-10-CM

## 2024-08-29 NOTE — Progress Notes (Signed)
 Subjective   Patient ID: Marissa [name], female    DOB: [birth year], 58 y.o.   MRN: [id number]  Chief Complaint  Patient presents with   Hypothyroidism   mass in chest    Referring provider: Oley Bascom RAMAN, NP  Gaylyn Luz is a 58 y.o. female with Past Medical History: No date: Cancer [facility])     Comment:  THYROID    CANCER No date: History of kidney stones   HPI  Patient presents today for follow-up on thyroid .  Overall she states that she has been doing well.  We will recheck thyroid  panel today.  Currently on synthroid  - 88 mcg daily. Patient states that she has been compliant with medications.  Patient has followed with otolaryngology for chest mass.  A referral was placed to cardiothoracic surgery and pulmonary. Patient declines going to referrals.  Denies f/c/s, n/v/d, hemoptysis, PND, leg swelling. Denies chest pain or edema.    No Known Allergies  Immunization History  Administered Date(s) Administered   Influenza Split 07/24/2012   Influenza, Seasonal, Injecte, Preservative Fre 06/15/2023   Influenza,inj,Quad PF,6+ Mos 06/18/2021   Tdap 07/25/2019   Unspecified SARS-COV-2 Vaccination 01/04/2020, 01/18/2020    Tobacco History: Social History   Tobacco Use  Smoking Status Never  Smokeless Tobacco Never   Counseling given: Not Answered   Outpatient Encounter Medications as of 08/29/2024  Medication Sig   acetaminophen  (TYLENOL ) 500 MG tablet Take 1,000 mg by mouth every 6 (six) hours as needed for moderate pain.   levothyroxine  (SYNTHROID ) 88 MCG tablet Take 1 tablet (88 mcg total) by mouth daily.   benzonatate  (TESSALON ) 100 MG capsule Take 1 capsule (100 mg total) by mouth every 8 (eight) hours. (Patient not taking: Reported on 08/29/2024)   furosemide  (LASIX ) 20 MG tablet Take 1 tablet (20 mg total) by mouth daily. (Patient not taking: Reported on 08/29/2024)   oxyCODONE  (ROXICODONE ) 5 MG immediate release tablet Take 0.5-1 tablets (2.5-5 mg total) by mouth every  6 (six) hours as needed for severe pain. (Patient not taking: Reported on 08/29/2024)   [DISCONTINUED] Cetirizine  HCl 10 MG CAPS Take 1 capsule (10 mg total) by mouth daily.   [DISCONTINUED] gabapentin  (NEURONTIN ) 300 MG capsule 1 tab po at bedtime 1st day, 1 tablet bid second day, then 1 tablet tid   No facility-administered encounter medications on file as of 08/29/2024.    Review of Systems  Review of Systems  Constitutional: Negative.   HENT: Negative.    Cardiovascular: Negative.   Gastrointestinal: Negative.   Allergic/Immunologic: Negative.   Neurological: Negative.   Psychiatric/Behavioral: Negative.       Objective:   BP 125/72 (BP Location: Left Arm, Patient Position: Sitting, Cuff Size: Normal)   Pulse 77   Wt 148 lb (67.1 kg)   SpO2 97%   BMI 27.96 kg/m   Wt Readings from Last 5 Encounters:  08/29/24 148 lb (67.1 kg)  02/23/24 147 lb 3.2 oz (66.8 kg)  10/05/23 146 lb (66.2 kg)  08/26/23 145 lb (65.8 kg)  06/15/23 145 lb (65.8 kg)     Physical Exam Vitals and nursing note reviewed.  Constitutional:      General: She is not in acute distress.    Appearance: She is well-developed.  Cardiovascular:     Rate and Rhythm: Normal rate and regular rhythm.  Pulmonary:     Effort: Pulmonary effort is normal.     Breath sounds: Normal breath sounds.  Neurological:     Mental Status: She  is alert and oriented to person, place, and time.       Assessment & Plan:   Hypothyroidism (acquired) -     Thyroid  Panel With TSH -     CBC -     Comprehensive metabolic panel with GFR     Return in about 6 months (around 02/26/2025).   Bascom GORMAN Borer, NP 08/29/2024

## 2024-08-30 LAB — COMPREHENSIVE METABOLIC PANEL WITH GFR
ALT: 7 IU/L (ref 0–32)
AST: 18 IU/L (ref 0–40)
Albumin: 4.4 g/dL (ref 3.8–4.9)
Alkaline Phosphatase: 124 IU/L (ref 49–135)
BUN/Creatinine Ratio: 13 (ref 9–23)
BUN: 10 mg/dL (ref 6–24)
Bilirubin Total: 0.4 mg/dL (ref 0.0–1.2)
CO2: 21 mmol/L (ref 20–29)
Calcium: 8.9 mg/dL (ref 8.7–10.2)
Chloride: 106 mmol/L (ref 96–106)
Creatinine, Ser: 0.76 mg/dL (ref 0.57–1.00)
Globulin, Total: 2.9 g/dL (ref 1.5–4.5)
Glucose: 85 mg/dL (ref 70–99)
Potassium: 4 mmol/L (ref 3.5–5.2)
Sodium: 142 mmol/L (ref 134–144)
Total Protein: 7.3 g/dL (ref 6.0–8.5)
eGFR: 92 mL/min/1.73 (ref >59)

## 2024-08-30 LAB — CBC
Hematocrit: 37.5 % (ref 34.0–46.6)
Hemoglobin: 11.3 g/dL (ref 11.1–15.9)
MCH: 22.7 pg — ABNORMAL LOW (ref 26.6–33.0)
MCHC: 30.1 g/dL — ABNORMAL LOW (ref 31.5–35.7)
MCV: 76 fL — ABNORMAL LOW (ref 79–97)
Platelets: 206 x10E3/uL (ref 150–450)
RBC: 4.97 x10E6/uL (ref 3.77–5.28)
RDW: 15 % (ref 11.7–15.4)
WBC: 5.1 x10E3/uL (ref 3.4–10.8)

## 2024-08-30 LAB — THYROID PANEL WITH TSH
Free Thyroxine Index: 2.5 (ref 1.2–4.9)
T3 Uptake Ratio: 30 % (ref 24–39)
T4, Total: 8.3 ug/dL (ref 4.5–12.0)
TSH: 1.01 u[IU]/mL (ref 0.450–4.500)

## 2024-09-05 ENCOUNTER — Ambulatory Visit: Payer: Self-pay | Admitting: Nurse Practitioner

## 2024-10-19 ENCOUNTER — Other Ambulatory Visit: Payer: Self-pay | Admitting: Nurse Practitioner

## 2024-10-19 DIAGNOSIS — E039 Hypothyroidism, unspecified: Secondary | ICD-10-CM

## 2024-10-19 NOTE — Telephone Encounter (Signed)
 levothyroxine  (SYNTHROID ) 88 MCG tablet [Pharmacy Med Name: LEVOTHYROXINE  0.088MG  ( ) TAB]

## 2025-03-01 ENCOUNTER — Ambulatory Visit: Payer: Self-pay | Admitting: Nurse Practitioner
